# Patient Record
Sex: Female | Born: 1975 | Hispanic: Yes | Marital: Single | Smoking: Current some day smoker
Health system: Southern US, Community
[De-identification: ages and names within clinical notes are randomized; demographics above are authoritative.]

## PROBLEM LIST (undated history)

## (undated) DIAGNOSIS — R011 Cardiac murmur, unspecified: Secondary | ICD-10-CM

## (undated) DIAGNOSIS — H543 Unqualified visual loss, both eyes: Secondary | ICD-10-CM

## (undated) DIAGNOSIS — E119 Type 2 diabetes mellitus without complications: Secondary | ICD-10-CM

## (undated) DIAGNOSIS — I83009 Varicose veins of unspecified lower extremity with ulcer of unspecified site: Secondary | ICD-10-CM

## (undated) DIAGNOSIS — I10 Essential (primary) hypertension: Secondary | ICD-10-CM

## (undated) DIAGNOSIS — L97909 Non-pressure chronic ulcer of unspecified part of unspecified lower leg with unspecified severity: Secondary | ICD-10-CM

## (undated) DIAGNOSIS — Z720 Tobacco use: Secondary | ICD-10-CM

## (undated) DIAGNOSIS — D509 Iron deficiency anemia, unspecified: Secondary | ICD-10-CM

## (undated) HISTORY — DX: Non-pressure chronic ulcer of unspecified part of unspecified lower leg with unspecified severity: L97.909

## (undated) HISTORY — DX: Non-pressure chronic ulcer of unspecified part of unspecified lower leg with unspecified severity: I83.009

## (undated) HISTORY — DX: Cardiac murmur, unspecified: R01.1

## (undated) HISTORY — DX: Iron deficiency anemia, unspecified: D50.9

## (undated) HISTORY — DX: Essential (primary) hypertension: I10

## (undated) HISTORY — PX: EYE SURGERY: SHX253

## (undated) HISTORY — DX: Tobacco use: Z72.0

## (undated) HISTORY — DX: Type 2 diabetes mellitus without complications: E11.9

---
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
## 2004-06-13 ENCOUNTER — Other Ambulatory Visit: Admission: RE | Admit: 2004-06-13 | Discharge: 2004-06-13 | Payer: Self-pay | Admitting: Obstetrics and Gynecology

## 2011-12-16 ENCOUNTER — Encounter (HOSPITAL_COMMUNITY): Payer: Self-pay | Admitting: *Deleted

## 2011-12-16 ENCOUNTER — Inpatient Hospital Stay (HOSPITAL_COMMUNITY)
Admission: AD | Admit: 2011-12-16 | Discharge: 2011-12-16 | Disposition: A | Discharge status: Home / Self Care | Payer: Medicare Other | Source: Ambulatory Visit | Attending: Obstetrics & Gynecology | Admitting: Obstetrics & Gynecology

## 2011-12-16 DIAGNOSIS — N911 Secondary amenorrhea: Secondary | ICD-10-CM

## 2011-12-16 DIAGNOSIS — N912 Amenorrhea, unspecified: Secondary | ICD-10-CM | POA: Insufficient documentation

## 2011-12-16 HISTORY — DX: Unqualified visual loss, both eyes: H54.3

## 2011-12-16 LAB — CBC
HCT: 38.7 % (ref 36.0–46.0)
Hemoglobin: 12.7 g/dL (ref 12.0–15.0)
MCH: 24.1 pg — ABNORMAL LOW (ref 26.0–34.0)
MCHC: 32.8 g/dL (ref 30.0–36.0)
MCV: 73.3 fL — ABNORMAL LOW (ref 78.0–100.0)
Platelets: 321 10*3/uL (ref 150–400)
RBC: 5.28 MIL/uL — ABNORMAL HIGH (ref 3.87–5.11)
RDW: 15.2 % (ref 11.5–15.5)
WBC: 10.1 10*3/uL (ref 4.0–10.5)

## 2011-12-16 LAB — GLUCOSE, RANDOM: Glucose, Bld: 166 mg/dL — ABNORMAL HIGH (ref 70–99)

## 2011-12-16 LAB — HCG, QUANTITATIVE, PREGNANCY: hCG, Beta Chain, Quant, S: <1 m[IU]/mL

## 2011-12-16 NOTE — MAU Note (Signed)
Patient states she has not had a period since 59[birth year]23. Denies any pain or bleeding at this time. Does sometime have lower abdominal pain  when standing and working.
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
## 2011-12-16 NOTE — MAU Note (Signed)
Patient states she has had 2 negative pregnancy tests.

## 2011-12-17 LAB — SEX HORMONE BINDING GLOBULIN: Sex Hormone Binding: 21 nmol/L (ref 18–114)

## 2011-12-17 LAB — TESTOSTERONE: Testosterone: 60.28 ng/dL (ref 10–70)

## 2011-12-17 NOTE — MAU Provider Note (Signed)
Attestation of Attending Supervision of Advanced Practitioner (CNM/NP): Evaluation and management procedures were performed by the Advanced Practitioner under my supervision and collaboration.  I have reviewed the Advanced Practitioner's note and chart, and I agree with the management and plan.  Diannah Rindfleisch, M.D. 12/17/2011 4:46 AM  

## 2011-12-18 LAB — TESTOSTERONE: Testosterone: 60.28 ng/dL (ref 10–70)

## 2011-12-18 LAB — TESTOSTERONE, % FREE: Testosterone-% Free: 2.3 % — ABNORMAL HIGH (ref 0.4–2.4)

## 2011-12-18 LAB — FOLLICLE STIMULATING HORMONE: FSH: 4 m[IU]/mL

## 2011-12-23 ENCOUNTER — Ambulatory Visit (HOSPITAL_COMMUNITY): Payer: Medicare Other

## 2011-12-23 ENCOUNTER — Ambulatory Visit (HOSPITAL_COMMUNITY)
Admission: RE | Admit: 2011-12-23 | Discharge: 2011-12-23 | Disposition: A | Discharge status: Home / Self Care | Payer: Medicare Other | Source: Ambulatory Visit | Attending: Physician Assistant | Admitting: Physician Assistant

## 2011-12-23 DIAGNOSIS — N83209 Unspecified ovarian cyst, unspecified side: Secondary | ICD-10-CM | POA: Insufficient documentation

## 2011-12-23 DIAGNOSIS — N912 Amenorrhea, unspecified: Secondary | ICD-10-CM | POA: Insufficient documentation

## 2011-12-23 DIAGNOSIS — N911 Secondary amenorrhea: Secondary | ICD-10-CM

## 2012-01-06 ENCOUNTER — Ambulatory Visit: Payer: Medicare Other | Admitting: Obstetrics & Gynecology

## 2012-02-07 ENCOUNTER — Encounter: Payer: Self-pay | Admitting: Obstetrics & Gynecology

## 2012-02-07 ENCOUNTER — Ambulatory Visit (INDEPENDENT_AMBULATORY_CARE_PROVIDER_SITE_OTHER): Payer: Medicare Other | Admitting: Obstetrics & Gynecology

## 2012-02-07 VITALS — BP 148/92 | HR 81 | Temp 98.2°F | Resp 12 | Ht 73.0 in | Wt 292.6 lb

## 2012-02-07 DIAGNOSIS — N911 Secondary amenorrhea: Secondary | ICD-10-CM | POA: Insufficient documentation

## 2012-02-07 DIAGNOSIS — N912 Amenorrhea, unspecified: Secondary | ICD-10-CM

## 2012-02-07 DIAGNOSIS — H543 Unqualified visual loss, both eyes: Secondary | ICD-10-CM

## 2012-02-07 MED ORDER — MEDROXYPROGESTERONE ACETATE 10 MG PO TABS: 10.0000 mg | ORAL_TABLET | Freq: Every day | ORAL | Status: DC

## 2012-02-07 NOTE — Patient Instructions (Signed)
Menstruacin (Menstruation) La menstruacin es la eliminacin mensual de sangre, tejidos, lquidos y mucus. Tambin se la conoce como perodo. En este perodo la Agricultural consultant. [place], o cantidad de [place], generalmente dura [place] 3 y [address] cada [address]. El ciclo menstrual est controlado por las hormonas.Las hormonas son sustancias qumicas que produce el organismo para regular sus diferentes funciones. El primer perodo menstrual puede comenzar en cualquier [employer] 8 y los 16 aos. Sin embargo, generalmente [employer] 11 y los [address]. Algunas nias tienen un ciclo menstrual mensual desde el comienzo. Pero, en los comienzos de la [place], no es infrecuente perder slo algunas gotas de sangre o Pension scheme manager la ropa interior. Tampoco es infrecuente [employer] perodos en un mes o que pase un mes o dos sin tenerlo en los primeros meses. SINTOMAS  Clicos abdominales leves a moderados.   Dolor en la zona baja de la espalda.  Los sntomas pueden aparecer [employer] 5 y [address] previos al comienzo del perodo menstrual, y se lo denomina sndrome premenstrual.  Dolor de [place].   Dolor e hinchazn en las mamas.   Hinchazn.   Somnolencia (fatiga.   Cambios en el estado de nimo.   Deseo intenso de consumir ciertos alimentos.  Estos son signos y sntomas normales y Orthoptist. Para ayudar a [employer], consulte con su mdico si puede tomar medicamentos de venta libre para el dolor o las [place]. Si no puede controlar los sntomas, consulte con su mdico.  LAS HORMONAS QUE INTERVIENEN EN EL CICLO MENSTRUAL La menstruacin se produce debido a las hormonas que segrega la glndula pituitaria en el cerebro y a los ovarios que afectan la superficie del tero. Primero, la glndula pituitaria produce en el cerebro la hormona estimulante del folculo.  Esta hormona estimula la produccin de [employer] ovarios, lo que engruesa la superficie del  tero y comienza a Environmental education officer un vulo en el ovario. Aproximadamente 14 das despus, la glndula pituitaria produce otra hormona denominada hormona luteinizante. La hormona luteinizante hace que el vulo salga del saco en el que se encuentra dentro del ovario. El saco vaco del ovario, denominado cuerpo lteo es estimulado por otra hormona proveniente de la glndula pituitaria que se denomina luteotrofina. El cuerpo lteo comienza a producir estrgenos y Education officer, museum. La progesterona prepara la superficie del tero para que el vulo fertilizado (vulo y espermatozoide) se adhiera a esta superficie del tero y comience a desarrollarse para formar el feto. Si el vulo no es fertilizado, el cuerpo lteo deja de producir estrgenos y Education officer, museum y desaparece, la superficie del tero se desprende y comienza el perodo menstrual. Entonces el ciclo menstrual se inicia una y [place] vez y Fish farm manager, excepto que ocurra un Psychiatrist o comience la menopausia. La secrecin de hormonas es un proceso complejo. Varias partes del organismo estn involucradas en muchas actividades qumicas. Las hormonas sexuales femeninas tambin cumplen otras funciones en el organismo de la [place]. Los estrgenos [employer] impulso sexual de Architectural technologist (libido). Es un diurtico natural (ayuda al organismo a [employer] lquidos). Tambin interviene en el proceso de formacin los [place]. Por lo tanto, Pharmacologist la salud hormonal es fundamental para todos los niveles del bienestar de la [place]. Generalmente estas hormonas estn presentes en cantidades normales y producen el ciclo menstrual. Lo ms importante es la relacin entre estos (pequeos) niveles de hormonas. Cuando el equilibrio se [place], se producen irregularidades menstruales. CMO SE PRODUCE EL CICLO  MENSTRUAL?  Los ciclos menstruales varan entre 21 y [address] siendo el promedio de [address]. El ciclo comienza Film/video editor en que se produce el sangrado. En este momento, la  glndula pituitaria en el cerebro libera FSH, que viaja a travs del torrente [employer]. La FSH estimula los folculos en los ovarios. Prepara al organismo para la ovulacin, la que se produce alrededor del da 14 del ciclo. Luego los ovarios liberan estrgenos y esto asegura que las condiciones en el tero sean las adecuadas para la implantacin del vulo fertilizado.   Cuando los niveles de estrgenos alcanzan un nivel lo suficientemente elevado, envan una seal a una glndula que se encuentra en el cerebro (glndula pituitaria) para liberar cierta cantidad de LH. Esto provoca la liberacin del vulo maduro del folculo (ovulacin). Generalmente slo un folculo libera un huevo, pero en algunos casos ms de un folculo liberan huevos, especialmente cuando se estimulan los ovarios por fertilizacin in vitro. Luego el vulo se instala en la trompa de [place] ms cercana y Artist. El folculo que ha estallado dentro del ovario, y que [place], ahora se denomina cuerpo lteo ( o "cuerpo amarillo") El cuerpo lteo sigue liberando (segregando) cantidades reducidas de estrgeno. Esto hace que se cierre y se endurezca el crvix. Esto seca el mucus llevndolo al estado natural de infertilidad.   El cuerpo lteo tambin comienza a Museum/gallery conservator grandes cantidades de progesterona. Esto hace que la cobertura interna del tero (el endometrio) se espese an ms, preparndose totalmente para recibir al vulo El vulo comienza su trayecto [place], desde las trompas de [employer]. Y le enva a los ovarios la seal para que no liberen ms vulos. Interviene en el regreso del mucus cervical a su estado de infertilidad.   Si el vulo se implanta exitosamente en el tejido que recubre internamente el tero y se produce el [place], los niveles de progesterona continuarn [place]. Generalmente, esta es la hormona que favorece en algunas mujeres embarazadas la sensacin de  [place], como una "euforia natural". Los niveles de progesterona vuelven a Software engineer despus del parto   Si la fertilizacin no se produce, el cuerpo [employer], y cesa la produccin de hormonas. Esta cada sbita de los niveles de progesterona hace que la superficie interna uterina se rompa, lo que produce una hemorragia (menstruacin).   Y as vuelve a Oncologist 1. Todo el proceso se repite nuevamente. Las mujeres atraviesan este ciclo todos los meses, desde la pubertad a la menopausia. El ciclo se interrumpe slo en el caso de embarazo y Tour manager, excepto que la mujer sufra problemas de salud que afecten su sistema hormonal o elija utilizar anticonceptivos orales y tenga perodos menstruales no naturales.  INSTRUCCIONES PARA EL CUIDADO DOMICILIARIO  Mantenga un registro de sus perodos utilizando un calendario.   Si utiliza tampones, use los menos absorbentes para evitar el sndrome de shock txico.   No deje el tampn en la vagina durante la noche o ms de 6 horas.   Utilice una toallita higinica para dormir.   Realice actividad fsica 3 a 5 veces por semana, o ms.   Evite los alimentos y bebidas que sabe empeorarn sus sntomas antes o durante el perodo.  SOLICITE ANTENCIN MDICA SI:  Su temperatura se eleva por encima de 100 F (37.8 C).   Sus perodos duran ms de [address].   Son tan abundantes que debe cambiarse el apsito o  el tampn cada 30 minutos.   Observa que elimina cogulos y nunca los haba tenido antes.   No obtiene alivio de sus sntomas con los medicamentos de [address].   Su perodo no se ha iniciado y han pasado ms de [address].  Document Released: 03/06/2005 Document Revised: 05/16/2011 [facility] Patient Information 2012 [place], [place].
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
## 2014-01-07 ENCOUNTER — Encounter (HOSPITAL_BASED_OUTPATIENT_CLINIC_OR_DEPARTMENT_OTHER): Payer: Medicare Other | Attending: General Surgery

## 2014-01-07 DIAGNOSIS — I10 Essential (primary) hypertension: Secondary | ICD-10-CM | POA: Diagnosis not present

## 2014-01-07 DIAGNOSIS — H543 Unqualified visual loss, both eyes: Secondary | ICD-10-CM | POA: Insufficient documentation

## 2014-01-07 DIAGNOSIS — E119 Type 2 diabetes mellitus without complications: Secondary | ICD-10-CM | POA: Diagnosis not present

## 2014-01-07 DIAGNOSIS — Z79899 Other long term (current) drug therapy: Secondary | ICD-10-CM | POA: Diagnosis not present

## 2014-01-07 DIAGNOSIS — L97809 Non-pressure chronic ulcer of other part of unspecified lower leg with unspecified severity: Secondary | ICD-10-CM | POA: Diagnosis present
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
## 2014-01-14 ENCOUNTER — Encounter (HOSPITAL_BASED_OUTPATIENT_CLINIC_OR_DEPARTMENT_OTHER): Payer: Medicare Other | Attending: General Surgery

## 2014-01-14 DIAGNOSIS — I87339 Chronic venous hypertension (idiopathic) with ulcer and inflammation of unspecified lower extremity: Secondary | ICD-10-CM | POA: Diagnosis present

## 2014-01-14 DIAGNOSIS — L97809 Non-pressure chronic ulcer of other part of unspecified lower leg with unspecified severity: Secondary | ICD-10-CM | POA: Diagnosis not present

## 2014-01-14 DIAGNOSIS — L97909 Non-pressure chronic ulcer of unspecified part of unspecified lower leg with unspecified severity: Principal | ICD-10-CM

## 2014-01-21 DIAGNOSIS — I87339 Chronic venous hypertension (idiopathic) with ulcer and inflammation of unspecified lower extremity: Secondary | ICD-10-CM | POA: Diagnosis not present

## 2014-01-21 DIAGNOSIS — L97809 Non-pressure chronic ulcer of other part of unspecified lower leg with unspecified severity: Secondary | ICD-10-CM | POA: Diagnosis not present

## 2014-01-28 DIAGNOSIS — L97809 Non-pressure chronic ulcer of other part of unspecified lower leg with unspecified severity: Secondary | ICD-10-CM | POA: Diagnosis not present

## 2014-01-28 DIAGNOSIS — I87339 Chronic venous hypertension (idiopathic) with ulcer and inflammation of unspecified lower extremity: Secondary | ICD-10-CM | POA: Diagnosis not present

## 2014-02-04 DIAGNOSIS — L97809 Non-pressure chronic ulcer of other part of unspecified lower leg with unspecified severity: Secondary | ICD-10-CM | POA: Diagnosis not present

## 2014-02-04 DIAGNOSIS — I87339 Chronic venous hypertension (idiopathic) with ulcer and inflammation of unspecified lower extremity: Secondary | ICD-10-CM | POA: Diagnosis not present

## 2014-02-11 ENCOUNTER — Encounter (HOSPITAL_BASED_OUTPATIENT_CLINIC_OR_DEPARTMENT_OTHER): Payer: Medicare Other | Attending: General Surgery

## 2014-02-11 DIAGNOSIS — L97909 Non-pressure chronic ulcer of unspecified part of unspecified lower leg with unspecified severity: Secondary | ICD-10-CM | POA: Insufficient documentation

## 2014-02-11 DIAGNOSIS — E1169 Type 2 diabetes mellitus with other specified complication: Secondary | ICD-10-CM | POA: Insufficient documentation

## 2014-02-11 DIAGNOSIS — I872 Venous insufficiency (chronic) (peripheral): Secondary | ICD-10-CM | POA: Diagnosis not present

## 2014-02-17 ENCOUNTER — Ambulatory Visit (HOSPITAL_COMMUNITY)
Admission: RE | Admit: 2014-02-17 | Discharge: 2014-02-17 | Disposition: A | Discharge status: Home / Self Care | Payer: Medicare Other | Source: Ambulatory Visit | Attending: Vascular Surgery | Admitting: Vascular Surgery

## 2014-02-17 ENCOUNTER — Other Ambulatory Visit (HOSPITAL_COMMUNITY): Payer: Self-pay | Admitting: General Surgery

## 2014-02-17 DIAGNOSIS — L97909 Non-pressure chronic ulcer of unspecified part of unspecified lower leg with unspecified severity: Secondary | ICD-10-CM

## 2014-02-17 DIAGNOSIS — I739 Peripheral vascular disease, unspecified: Secondary | ICD-10-CM | POA: Insufficient documentation

## 2014-02-18 DIAGNOSIS — L97909 Non-pressure chronic ulcer of unspecified part of unspecified lower leg with unspecified severity: Secondary | ICD-10-CM | POA: Diagnosis not present

## 2014-02-18 DIAGNOSIS — E1169 Type 2 diabetes mellitus with other specified complication: Secondary | ICD-10-CM | POA: Diagnosis not present

## 2014-02-18 DIAGNOSIS — I872 Venous insufficiency (chronic) (peripheral): Secondary | ICD-10-CM | POA: Diagnosis not present

## 2014-02-25 DIAGNOSIS — E1169 Type 2 diabetes mellitus with other specified complication: Secondary | ICD-10-CM | POA: Diagnosis not present

## 2014-02-25 DIAGNOSIS — I872 Venous insufficiency (chronic) (peripheral): Secondary | ICD-10-CM | POA: Diagnosis not present

## 2014-02-25 DIAGNOSIS — L97909 Non-pressure chronic ulcer of unspecified part of unspecified lower leg with unspecified severity: Secondary | ICD-10-CM | POA: Diagnosis not present

## 2014-03-04 DIAGNOSIS — I872 Venous insufficiency (chronic) (peripheral): Secondary | ICD-10-CM | POA: Diagnosis not present

## 2014-03-04 DIAGNOSIS — E1169 Type 2 diabetes mellitus with other specified complication: Secondary | ICD-10-CM | POA: Diagnosis not present

## 2014-03-04 DIAGNOSIS — L97909 Non-pressure chronic ulcer of unspecified part of unspecified lower leg with unspecified severity: Secondary | ICD-10-CM | POA: Diagnosis not present

## 2014-03-11 ENCOUNTER — Encounter (HOSPITAL_BASED_OUTPATIENT_CLINIC_OR_DEPARTMENT_OTHER): Payer: Medicare Other | Attending: General Surgery

## 2014-03-11 DIAGNOSIS — L97919 Non-pressure chronic ulcer of unspecified part of right lower leg with unspecified severity: Secondary | ICD-10-CM | POA: Insufficient documentation

## 2014-03-11 DIAGNOSIS — I87331 Chronic venous hypertension (idiopathic) with ulcer and inflammation of right lower extremity: Secondary | ICD-10-CM | POA: Diagnosis present

## 2014-03-18 DIAGNOSIS — I87331 Chronic venous hypertension (idiopathic) with ulcer and inflammation of right lower extremity: Secondary | ICD-10-CM | POA: Diagnosis not present

## 2014-03-18 DIAGNOSIS — L97919 Non-pressure chronic ulcer of unspecified part of right lower leg with unspecified severity: Secondary | ICD-10-CM | POA: Diagnosis not present

## 2014-03-24 ENCOUNTER — Encounter: Payer: Self-pay | Admitting: Vascular Surgery

## 2014-03-25 ENCOUNTER — Encounter: Payer: Self-pay | Admitting: Vascular Surgery

## 2014-03-25 ENCOUNTER — Ambulatory Visit (INDEPENDENT_AMBULATORY_CARE_PROVIDER_SITE_OTHER): Payer: Medicare Other | Admitting: Vascular Surgery

## 2014-03-25 VITALS — BP 125/85 | HR 89 | Ht 73.0 in | Wt 283.0 lb

## 2014-03-25 DIAGNOSIS — L97909 Non-pressure chronic ulcer of unspecified part of unspecified lower leg with unspecified severity: Secondary | ICD-10-CM

## 2014-03-25 DIAGNOSIS — I83012 Varicose veins of right lower extremity with ulcer of calf: Secondary | ICD-10-CM

## 2014-03-25 DIAGNOSIS — I83014 Varicose veins of right lower extremity with ulcer of heel and midfoot: Secondary | ICD-10-CM

## 2014-03-25 DIAGNOSIS — I87331 Chronic venous hypertension (idiopathic) with ulcer and inflammation of right lower extremity: Secondary | ICD-10-CM | POA: Diagnosis not present

## 2014-03-25 DIAGNOSIS — I872 Venous insufficiency (chronic) (peripheral): Secondary | ICD-10-CM | POA: Insufficient documentation

## 2014-03-25 DIAGNOSIS — I83015 Varicose veins of right lower extremity with ulcer other part of foot: Secondary | ICD-10-CM

## 2014-03-25 DIAGNOSIS — I83013 Varicose veins of right lower extremity with ulcer of ankle: Secondary | ICD-10-CM

## 2014-03-25 DIAGNOSIS — I83019 Varicose veins of right lower extremity with ulcer of unspecified site: Secondary | ICD-10-CM

## 2014-03-25 DIAGNOSIS — L97919 Non-pressure chronic ulcer of unspecified part of right lower leg with unspecified severity: Secondary | ICD-10-CM | POA: Diagnosis not present

## 2014-03-25 DIAGNOSIS — I83009 Varicose veins of unspecified lower extremity with ulcer of unspecified site: Secondary | ICD-10-CM | POA: Insufficient documentation

## 2014-03-25 DIAGNOSIS — I83011 Varicose veins of right lower extremity with ulcer of thigh: Secondary | ICD-10-CM

## 2014-03-25 DIAGNOSIS — IMO0001 Reserved for inherently not codable concepts without codable children: Secondary | ICD-10-CM

## 2014-03-25 DIAGNOSIS — I83018 Varicose veins of right lower extremity with ulcer other part of lower leg: Secondary | ICD-10-CM
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
## 2014-04-01 DIAGNOSIS — L97919 Non-pressure chronic ulcer of unspecified part of right lower leg with unspecified severity: Secondary | ICD-10-CM | POA: Diagnosis not present

## 2014-04-01 DIAGNOSIS — I87331 Chronic venous hypertension (idiopathic) with ulcer and inflammation of right lower extremity: Secondary | ICD-10-CM | POA: Diagnosis not present

## 2014-04-08 DIAGNOSIS — I87331 Chronic venous hypertension (idiopathic) with ulcer and inflammation of right lower extremity: Secondary | ICD-10-CM | POA: Diagnosis not present

## 2014-04-08 DIAGNOSIS — L97919 Non-pressure chronic ulcer of unspecified part of right lower leg with unspecified severity: Secondary | ICD-10-CM | POA: Diagnosis not present

## 2014-04-15 ENCOUNTER — Encounter (HOSPITAL_BASED_OUTPATIENT_CLINIC_OR_DEPARTMENT_OTHER): Payer: Medicare Other | Attending: General Surgery

## 2014-04-15 DIAGNOSIS — I87331 Chronic venous hypertension (idiopathic) with ulcer and inflammation of right lower extremity: Secondary | ICD-10-CM | POA: Diagnosis not present

## 2014-04-15 DIAGNOSIS — L97811 Non-pressure chronic ulcer of other part of right lower leg limited to breakdown of skin: Secondary | ICD-10-CM | POA: Insufficient documentation

## 2014-04-22 DIAGNOSIS — L97811 Non-pressure chronic ulcer of other part of right lower leg limited to breakdown of skin: Secondary | ICD-10-CM | POA: Diagnosis not present

## 2014-04-22 DIAGNOSIS — I87331 Chronic venous hypertension (idiopathic) with ulcer and inflammation of right lower extremity: Secondary | ICD-10-CM | POA: Diagnosis not present

## 2014-04-29 DIAGNOSIS — I87331 Chronic venous hypertension (idiopathic) with ulcer and inflammation of right lower extremity: Secondary | ICD-10-CM | POA: Diagnosis not present

## 2014-05-04 DIAGNOSIS — L97811 Non-pressure chronic ulcer of other part of right lower leg limited to breakdown of skin: Secondary | ICD-10-CM | POA: Diagnosis not present

## 2014-05-04 DIAGNOSIS — I87331 Chronic venous hypertension (idiopathic) with ulcer and inflammation of right lower extremity: Secondary | ICD-10-CM | POA: Diagnosis not present

## 2014-05-13 ENCOUNTER — Encounter (HOSPITAL_BASED_OUTPATIENT_CLINIC_OR_DEPARTMENT_OTHER): Payer: Medicare Other | Attending: General Surgery

## 2014-05-13 DIAGNOSIS — I87332 Chronic venous hypertension (idiopathic) with ulcer and inflammation of left lower extremity: Secondary | ICD-10-CM | POA: Insufficient documentation

## 2014-05-13 DIAGNOSIS — L97929 Non-pressure chronic ulcer of unspecified part of left lower leg with unspecified severity: Secondary | ICD-10-CM | POA: Insufficient documentation

## 2014-05-20 DIAGNOSIS — I87332 Chronic venous hypertension (idiopathic) with ulcer and inflammation of left lower extremity: Secondary | ICD-10-CM | POA: Diagnosis not present

## 2014-05-20 DIAGNOSIS — L97929 Non-pressure chronic ulcer of unspecified part of left lower leg with unspecified severity: Secondary | ICD-10-CM | POA: Diagnosis not present

## 2014-05-27 DIAGNOSIS — I87332 Chronic venous hypertension (idiopathic) with ulcer and inflammation of left lower extremity: Secondary | ICD-10-CM | POA: Diagnosis not present

## 2014-05-27 DIAGNOSIS — L97929 Non-pressure chronic ulcer of unspecified part of left lower leg with unspecified severity: Secondary | ICD-10-CM | POA: Diagnosis not present

## 2014-06-02 DIAGNOSIS — I87332 Chronic venous hypertension (idiopathic) with ulcer and inflammation of left lower extremity: Secondary | ICD-10-CM | POA: Diagnosis not present

## 2014-06-02 DIAGNOSIS — L97929 Non-pressure chronic ulcer of unspecified part of left lower leg with unspecified severity: Secondary | ICD-10-CM | POA: Diagnosis not present

## 2014-06-09 DIAGNOSIS — L97929 Non-pressure chronic ulcer of unspecified part of left lower leg with unspecified severity: Secondary | ICD-10-CM | POA: Diagnosis not present

## 2014-06-09 DIAGNOSIS — I87332 Chronic venous hypertension (idiopathic) with ulcer and inflammation of left lower extremity: Secondary | ICD-10-CM | POA: Diagnosis not present

## 2014-06-17 ENCOUNTER — Encounter (HOSPITAL_BASED_OUTPATIENT_CLINIC_OR_DEPARTMENT_OTHER): Payer: Medicare Other | Attending: Internal Medicine

## 2014-06-17 DIAGNOSIS — I87331 Chronic venous hypertension (idiopathic) with ulcer and inflammation of right lower extremity: Secondary | ICD-10-CM | POA: Insufficient documentation

## 2014-06-17 DIAGNOSIS — L97311 Non-pressure chronic ulcer of right ankle limited to breakdown of skin: Secondary | ICD-10-CM | POA: Diagnosis not present

## 2014-06-24 DIAGNOSIS — L97311 Non-pressure chronic ulcer of right ankle limited to breakdown of skin: Secondary | ICD-10-CM | POA: Diagnosis not present

## 2014-06-24 DIAGNOSIS — I87331 Chronic venous hypertension (idiopathic) with ulcer and inflammation of right lower extremity: Secondary | ICD-10-CM | POA: Diagnosis not present

## 2014-06-28 ENCOUNTER — Ambulatory Visit: Payer: Medicare Other | Admitting: Vascular Surgery

## 2014-07-04 ENCOUNTER — Encounter: Payer: Self-pay | Admitting: Vascular Surgery

## 2014-07-05 ENCOUNTER — Ambulatory Visit: Payer: Medicare Other | Admitting: Vascular Surgery

## 2014-07-08 DIAGNOSIS — I87331 Chronic venous hypertension (idiopathic) with ulcer and inflammation of right lower extremity: Secondary | ICD-10-CM | POA: Diagnosis not present

## 2014-07-08 DIAGNOSIS — L97311 Non-pressure chronic ulcer of right ankle limited to breakdown of skin: Secondary | ICD-10-CM | POA: Diagnosis not present

## 2014-07-15 ENCOUNTER — Encounter (HOSPITAL_BASED_OUTPATIENT_CLINIC_OR_DEPARTMENT_OTHER): Payer: Medicare Other | Attending: Internal Medicine

## 2014-07-15 DIAGNOSIS — L97919 Non-pressure chronic ulcer of unspecified part of right lower leg with unspecified severity: Secondary | ICD-10-CM | POA: Insufficient documentation

## 2014-07-15 DIAGNOSIS — I87331 Chronic venous hypertension (idiopathic) with ulcer and inflammation of right lower extremity: Secondary | ICD-10-CM | POA: Insufficient documentation

## 2014-07-22 DIAGNOSIS — L97919 Non-pressure chronic ulcer of unspecified part of right lower leg with unspecified severity: Secondary | ICD-10-CM | POA: Diagnosis not present

## 2014-07-22 DIAGNOSIS — I87331 Chronic venous hypertension (idiopathic) with ulcer and inflammation of right lower extremity: Secondary | ICD-10-CM | POA: Diagnosis not present

## 2014-07-28 DIAGNOSIS — L97919 Non-pressure chronic ulcer of unspecified part of right lower leg with unspecified severity: Secondary | ICD-10-CM | POA: Diagnosis not present

## 2014-07-28 DIAGNOSIS — I87331 Chronic venous hypertension (idiopathic) with ulcer and inflammation of right lower extremity: Secondary | ICD-10-CM | POA: Diagnosis not present

## 2014-08-12 ENCOUNTER — Encounter (HOSPITAL_BASED_OUTPATIENT_CLINIC_OR_DEPARTMENT_OTHER): Payer: Medicare Other | Attending: Internal Medicine

## 2014-08-12 DIAGNOSIS — L97811 Non-pressure chronic ulcer of other part of right lower leg limited to breakdown of skin: Secondary | ICD-10-CM | POA: Insufficient documentation

## 2014-08-12 DIAGNOSIS — I87331 Chronic venous hypertension (idiopathic) with ulcer and inflammation of right lower extremity: Secondary | ICD-10-CM | POA: Insufficient documentation

## 2014-08-19 DIAGNOSIS — I87331 Chronic venous hypertension (idiopathic) with ulcer and inflammation of right lower extremity: Secondary | ICD-10-CM | POA: Diagnosis not present

## 2014-08-26 DIAGNOSIS — I87331 Chronic venous hypertension (idiopathic) with ulcer and inflammation of right lower extremity: Secondary | ICD-10-CM | POA: Diagnosis not present

## 2014-08-26 DIAGNOSIS — L97811 Non-pressure chronic ulcer of other part of right lower leg limited to breakdown of skin: Secondary | ICD-10-CM | POA: Diagnosis not present

## 2014-08-29 LAB — GLUCOSE, CAPILLARY: Glucose-Capillary: 168 mg/dL — ABNORMAL HIGH (ref 70–99)

## 2014-09-09 ENCOUNTER — Encounter (HOSPITAL_BASED_OUTPATIENT_CLINIC_OR_DEPARTMENT_OTHER): Payer: Medicare Other | Attending: Internal Medicine

## 2014-09-09 DIAGNOSIS — L97811 Non-pressure chronic ulcer of other part of right lower leg limited to breakdown of skin: Secondary | ICD-10-CM | POA: Diagnosis not present

## 2014-09-09 DIAGNOSIS — I872 Venous insufficiency (chronic) (peripheral): Secondary | ICD-10-CM | POA: Diagnosis not present

## 2014-09-16 DIAGNOSIS — I872 Venous insufficiency (chronic) (peripheral): Secondary | ICD-10-CM | POA: Diagnosis not present

## 2014-09-23 DIAGNOSIS — I872 Venous insufficiency (chronic) (peripheral): Secondary | ICD-10-CM | POA: Diagnosis not present

## 2014-09-23 DIAGNOSIS — L97811 Non-pressure chronic ulcer of other part of right lower leg limited to breakdown of skin: Secondary | ICD-10-CM | POA: Diagnosis not present

## 2014-10-07 DIAGNOSIS — L97811 Non-pressure chronic ulcer of other part of right lower leg limited to breakdown of skin: Secondary | ICD-10-CM | POA: Diagnosis not present

## 2014-10-07 DIAGNOSIS — I872 Venous insufficiency (chronic) (peripheral): Secondary | ICD-10-CM | POA: Diagnosis not present

## 2014-10-14 ENCOUNTER — Encounter (HOSPITAL_BASED_OUTPATIENT_CLINIC_OR_DEPARTMENT_OTHER): Payer: Medicare Other | Attending: Internal Medicine

## 2014-10-14 DIAGNOSIS — L97811 Non-pressure chronic ulcer of other part of right lower leg limited to breakdown of skin: Secondary | ICD-10-CM | POA: Diagnosis not present

## 2014-10-14 DIAGNOSIS — I872 Venous insufficiency (chronic) (peripheral): Secondary | ICD-10-CM | POA: Insufficient documentation

## 2014-10-21 DIAGNOSIS — L97811 Non-pressure chronic ulcer of other part of right lower leg limited to breakdown of skin: Secondary | ICD-10-CM | POA: Diagnosis not present

## 2014-10-21 DIAGNOSIS — I872 Venous insufficiency (chronic) (peripheral): Secondary | ICD-10-CM | POA: Diagnosis not present

## 2014-10-28 DIAGNOSIS — L97811 Non-pressure chronic ulcer of other part of right lower leg limited to breakdown of skin: Secondary | ICD-10-CM | POA: Diagnosis not present

## 2014-10-28 DIAGNOSIS — I872 Venous insufficiency (chronic) (peripheral): Secondary | ICD-10-CM | POA: Diagnosis not present

## 2014-11-04 DIAGNOSIS — L97811 Non-pressure chronic ulcer of other part of right lower leg limited to breakdown of skin: Secondary | ICD-10-CM | POA: Diagnosis not present

## 2014-11-04 DIAGNOSIS — I872 Venous insufficiency (chronic) (peripheral): Secondary | ICD-10-CM | POA: Diagnosis not present

## 2014-11-11 ENCOUNTER — Encounter (HOSPITAL_BASED_OUTPATIENT_CLINIC_OR_DEPARTMENT_OTHER): Payer: Medicare Other | Attending: Internal Medicine

## 2014-11-11 DIAGNOSIS — I872 Venous insufficiency (chronic) (peripheral): Secondary | ICD-10-CM | POA: Diagnosis not present

## 2014-11-11 DIAGNOSIS — L97811 Non-pressure chronic ulcer of other part of right lower leg limited to breakdown of skin: Secondary | ICD-10-CM | POA: Insufficient documentation

## 2014-11-18 DIAGNOSIS — L97811 Non-pressure chronic ulcer of other part of right lower leg limited to breakdown of skin: Secondary | ICD-10-CM | POA: Diagnosis not present

## 2014-11-18 DIAGNOSIS — I872 Venous insufficiency (chronic) (peripheral): Secondary | ICD-10-CM | POA: Diagnosis not present

## 2014-11-25 DIAGNOSIS — L97811 Non-pressure chronic ulcer of other part of right lower leg limited to breakdown of skin: Secondary | ICD-10-CM | POA: Diagnosis not present

## 2014-11-25 DIAGNOSIS — I872 Venous insufficiency (chronic) (peripheral): Secondary | ICD-10-CM | POA: Diagnosis not present

## 2014-12-02 DIAGNOSIS — I872 Venous insufficiency (chronic) (peripheral): Secondary | ICD-10-CM | POA: Diagnosis not present

## 2014-12-02 DIAGNOSIS — L97811 Non-pressure chronic ulcer of other part of right lower leg limited to breakdown of skin: Secondary | ICD-10-CM | POA: Diagnosis not present

## 2014-12-09 ENCOUNTER — Encounter (HOSPITAL_BASED_OUTPATIENT_CLINIC_OR_DEPARTMENT_OTHER): Payer: Medicare Other | Attending: Internal Medicine

## 2014-12-09 DIAGNOSIS — I872 Venous insufficiency (chronic) (peripheral): Secondary | ICD-10-CM | POA: Insufficient documentation

## 2014-12-09 DIAGNOSIS — L97811 Non-pressure chronic ulcer of other part of right lower leg limited to breakdown of skin: Secondary | ICD-10-CM | POA: Insufficient documentation

## 2016-03-18 ENCOUNTER — Encounter (HOSPITAL_COMMUNITY): Payer: Self-pay | Admitting: Family Medicine

## 2016-03-18 ENCOUNTER — Ambulatory Visit (HOSPITAL_COMMUNITY)
Admission: EM | Admit: 2016-03-18 | Discharge: 2016-03-18 | Disposition: A | Discharge status: Home / Self Care | Payer: Medicare Other | Attending: Internal Medicine | Admitting: Internal Medicine

## 2016-03-18 DIAGNOSIS — E118 Type 2 diabetes mellitus with unspecified complications: Secondary | ICD-10-CM | POA: Diagnosis not present

## 2016-03-18 DIAGNOSIS — Z760 Encounter for issue of repeat prescription: Secondary | ICD-10-CM

## 2016-03-18 MED ORDER — METFORMIN HCL 1000 MG PO TABS: 1000.0000 mg | ORAL_TABLET | Freq: Two times a day (BID) | ORAL | 1 refills | Status: DC

## 2016-03-18 MED ORDER — GLIMEPIRIDE 2 MG PO TABS: 2.0000 mg | ORAL_TABLET | Freq: Every day | ORAL | 0 refills | Status: DC

## 2016-03-18 NOTE — ED Triage Notes (Signed)
Pt here for medication refill. She needs all of her meds
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
## 2016-04-25 ENCOUNTER — Ambulatory Visit: Payer: Medicare Other | Admitting: Nurse Practitioner

## 2016-05-17 ENCOUNTER — Ambulatory Visit (INDEPENDENT_AMBULATORY_CARE_PROVIDER_SITE_OTHER): Payer: Medicare Other | Admitting: Internal Medicine

## 2016-05-17 ENCOUNTER — Encounter: Payer: Self-pay | Admitting: Internal Medicine

## 2016-05-17 VITALS — BP 139/74 | HR 80 | Temp 98.1°F | Ht 73.0 in | Wt 295.5 lb

## 2016-05-17 DIAGNOSIS — Z0000 Encounter for general adult medical examination without abnormal findings: Secondary | ICD-10-CM | POA: Insufficient documentation

## 2016-05-17 DIAGNOSIS — I10 Essential (primary) hypertension: Secondary | ICD-10-CM | POA: Diagnosis not present

## 2016-05-17 DIAGNOSIS — E118 Type 2 diabetes mellitus with unspecified complications: Secondary | ICD-10-CM | POA: Diagnosis not present

## 2016-05-17 DIAGNOSIS — Z7984 Long term (current) use of oral hypoglycemic drugs: Secondary | ICD-10-CM | POA: Diagnosis not present

## 2016-05-17 DIAGNOSIS — F329 Major depressive disorder, single episode, unspecified: Secondary | ICD-10-CM

## 2016-05-17 DIAGNOSIS — F32A Depression, unspecified: Secondary | ICD-10-CM

## 2016-05-17 DIAGNOSIS — E119 Type 2 diabetes mellitus without complications: Secondary | ICD-10-CM | POA: Insufficient documentation

## 2016-05-17 DIAGNOSIS — F17210 Nicotine dependence, cigarettes, uncomplicated: Secondary | ICD-10-CM

## 2016-05-17 DIAGNOSIS — R109 Unspecified abdominal pain: Secondary | ICD-10-CM

## 2016-05-17 DIAGNOSIS — D509 Iron deficiency anemia, unspecified: Secondary | ICD-10-CM | POA: Insufficient documentation

## 2016-05-17 DIAGNOSIS — Z8249 Family history of ischemic heart disease and other diseases of the circulatory system: Secondary | ICD-10-CM

## 2016-05-17 DIAGNOSIS — D649 Anemia, unspecified: Secondary | ICD-10-CM

## 2016-05-17 DIAGNOSIS — Z79899 Other long term (current) drug therapy: Secondary | ICD-10-CM

## 2016-05-17 LAB — GLUCOSE, CAPILLARY: Glucose-Capillary: 228 mg/dL — ABNORMAL HIGH (ref 65–99)

## 2016-05-17 LAB — POCT GLYCOSYLATED HEMOGLOBIN (HGB A1C): Hemoglobin A1C: 9.1

## 2016-05-17 MED ORDER — GLIMEPIRIDE 2 MG PO TABS: 4.0000 mg | ORAL_TABLET | Freq: Every day | ORAL | 1 refills | Status: DC

## 2016-05-17 MED ORDER — SERTRALINE HCL 50 MG PO TABS: 50.0000 mg | ORAL_TABLET | Freq: Every day | ORAL | 2 refills | Status: DC

## 2016-05-17 NOTE — Patient Instructions (Addendum)
Please continue to take your medications as prescribed.   We have increased your Glimepiride to 4mg  daily to better control your diabetes.  We have started a new medication, Zoloft to help improve your depression. This medication may take 2-3 weeks to start having an effect.  We collected a number of lab tests today, we will call if any have abnormal results.  Please return to clinic in 2-4 weeks, you can call [phone] to schedule an appointment.  ----------------------------------- -----------------------------------  Contine tomando sus medicamentos segn lo recetado.  Hemos aumentado su Glimepiride a 4 mg diarios para controlar mejor su diabetes.  Hemos comenzado un nuevo medicamento, Zoloft para ayudar a mejorar su depresin. Este medicamento puede tomar 2-3 semanas para comenzar a Research scientist (medical).  Hoy recolectamos varias pruebas de laboratorio, las llamaremos si tienen [employer].  Regrese a la clnica en 2-4 semanas, puede llamar al [phone] para programar una cita.
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
## 2016-05-18 ENCOUNTER — Encounter: Payer: Self-pay | Admitting: Internal Medicine

## 2016-05-18 DIAGNOSIS — K439 Ventral hernia without obstruction or gangrene: Secondary | ICD-10-CM | POA: Insufficient documentation

## 2016-05-18 LAB — CMP14 + ANION GAP
ALBUMIN: 3.9 g/dL (ref 3.5–5.5)
ALT: 49 IU/L — ABNORMAL HIGH (ref 0–32)
AST: 32 IU/L (ref 0–40)
Albumin/Globulin Ratio: 1.6 (ref 1.2–2.2)
Alkaline Phosphatase: 98 IU/L (ref 39–117)
Anion Gap: 18 mmol/L (ref 10.0–18.0)
BILIRUBIN TOTAL: 0.2 mg/dL (ref 0.0–1.2)
BUN / CREAT RATIO: 13 (ref 9–23)
BUN: 6 mg/dL (ref 6–20)
CHLORIDE: 99 mmol/L (ref 96–106)
CO2: 19 mmol/L (ref 18–29)
Calcium: 8.7 mg/dL (ref 8.7–10.2)
Creatinine, Ser: 0.47 mg/dL — ABNORMAL LOW (ref 0.57–1.00)
GFR calc Af Amer: 145 mL/min/{1.73_m2} (ref >59)
GFR calc non Af Amer: 126 mL/min/{1.73_m2} (ref >59)
GLOBULIN, TOTAL: 2.5 g/dL (ref 1.5–4.5)
Glucose: 192 mg/dL — ABNORMAL HIGH (ref 65–99)
Potassium: 3.8 mmol/L (ref 3.5–5.2)
SODIUM: 136 mmol/L (ref 134–144)
TOTAL PROTEIN: 6.4 g/dL (ref 6.0–8.5)

## 2016-05-18 LAB — FERRITIN: Ferritin: 10 ng/mL — ABNORMAL LOW (ref 15–150)

## 2016-05-18 LAB — LIPID PANEL
Chol/HDL Ratio: 4.8 ratio units — ABNORMAL HIGH (ref 0.0–4.4)
Cholesterol, Total: 145 mg/dL (ref 100–199)
HDL: 30 mg/dL — ABNORMAL LOW (ref >39)
LDL Calculated: 94 mg/dL (ref 0–99)
Triglycerides: 107 mg/dL (ref 0–149)
VLDL Cholesterol Cal: 21 mg/dL (ref 5–40)

## 2016-05-18 LAB — CBC
Hematocrit: 30.8 % — ABNORMAL LOW (ref 34.0–46.6)
Hemoglobin: 9.4 g/dL — ABNORMAL LOW (ref 11.1–15.9)
MCH: 19.5 pg — ABNORMAL LOW (ref 26.6–33.0)
MCHC: 30.5 g/dL — ABNORMAL LOW (ref 31.5–35.7)
MCV: 64 fL — ABNORMAL LOW (ref 79–97)
Platelets: 324 x10E3/uL (ref 150–379)
RBC: 4.82 x10E6/uL (ref 3.77–5.28)
RDW: 18.4 % — ABNORMAL HIGH (ref 12.3–15.4)
WBC: 9.6 x10E3/uL (ref 3.4–10.8)

## 2016-05-18 MED ORDER — FERROUS SULFATE 325 (65 FE) MG PO TBEC: 325.0000 mg | DELAYED_RELEASE_TABLET | Freq: Two times a day (BID) | ORAL | 2 refills | Status: DC

## 2016-05-18 MED ORDER — RANITIDINE HCL 150 MG PO TABS: 150.0000 mg | ORAL_TABLET | Freq: Two times a day (BID) | ORAL | 1 refills | Status: DC

## 2016-05-18 NOTE — Assessment & Plan Note (Signed)
HbA1c 9.1 today. She reports last check it was 11.2. Endorses very poor diet - eats large quantities of rice, other carbs, greasy foods, and drinks 2-3 [place] sodas daily.   Plan: - Strongly encouraged dietary modification - Ambulatory referral for nutrition and diabetes education - Continue Metformin 1000 BID - Increase Glimepiride to 4 mg daily  - Recheck A1c in 3 months

## 2016-05-18 NOTE — Assessment & Plan Note (Signed)
Reports feeling very depressed and anxious recently, endorses early-morning waking, anxious about her father and his health, not having much family/friends support nearby. Often feels isolated due to blindness and language barrier. Suspect some combination of GAD/depression  Plan: - Start Zoloft 50 mg daily - Investigate further at next visit

## 2016-05-18 NOTE — Assessment & Plan Note (Signed)
Initial BP 151/82 and 139/74 on recheck. Goal SBP <130/80 given diabetes. Patient endorsing very poor diet and eats sweet, salty, spicy food very often.  Plan: - Encouraged dietary modification, exercise - Continue Lisinopril-HCTZ 10-12.5 daily, if remains hypertensive at next visit can increase to 20-25

## 2016-05-18 NOTE — Assessment & Plan Note (Addendum)
Reports history of iron deficiency anemia in the past and requiring oral iron supplementation, reports having "bleeding hemorrhoids" in the past but denies symptoms of such currently. Also reports consistently having abdominal pain before and after meals. Abdomen non-tender to palpation.  Plan: - Check CBC, ferritin - Hb 9.4 with low MCV and ferritin low consistent with IDA - Start Ferrous sulfate 325 mg BID - Start Ranitidine 150 mg BID for possible PUD - Investigate GI/GYN symptoms further at next visit

## 2016-05-18 NOTE — Assessment & Plan Note (Addendum)
Reports vague abdominal pain before and after meals - unable to fully characterize at this time. Non-tender to palpation on exam today. PUD vs cholelithiasis vs (?).   Plan: - CMP, CBC today - Has iron deficiency anemia of unknown etiology - Starting H2 blocker for possible PUD - Investigate further at next visit

## 2016-05-18 NOTE — Assessment & Plan Note (Signed)
Provided flu vaccine. Screening lipid panel collected in patient with DM and HTN.  Lipid Panel     Component Value Date/Time   CHOL 145 05/17/2016 1513   TRIG 107 05/17/2016 1513   HDL 30 (L) 05/17/2016 1513   CHOLHDL 4.8 (H) 05/17/2016 1513   LDLCALC 94 05/17/2016 1513

## 2016-05-22 ENCOUNTER — Telehealth: Payer: Self-pay | Admitting: Pharmacist

## 2016-05-22 NOTE — Progress Notes (Signed)
Calling patient to help with medications, unable to reach.

## 2016-05-22 NOTE — Progress Notes (Signed)
Internal Medicine Clinic Attending  I saw and evaluated the patient.  I personally confirmed the key portions of the history and exam documented by Dr. Johnson and I reviewed pertinent patient test results.  The assessment, diagnosis, and plan were formulated together and I agree with the documentation in the resident's note.  

## 2016-06-13 ENCOUNTER — Telehealth: Payer: Self-pay | Admitting: General Practice

## 2016-06-13 NOTE — Telephone Encounter (Signed)
APT. REMINDER CALL, LMTCB °

## 2016-06-14 ENCOUNTER — Ambulatory Visit (INDEPENDENT_AMBULATORY_CARE_PROVIDER_SITE_OTHER): Payer: Medicare Other | Admitting: Internal Medicine

## 2016-06-14 ENCOUNTER — Ambulatory Visit (INDEPENDENT_AMBULATORY_CARE_PROVIDER_SITE_OTHER): Payer: Medicare Other | Admitting: Dietician

## 2016-06-14 VITALS — BP 138/80 | HR 84 | Temp 97.6°F | Wt 283.7 lb

## 2016-06-14 DIAGNOSIS — Z0000 Encounter for general adult medical examination without abnormal findings: Secondary | ICD-10-CM

## 2016-06-14 DIAGNOSIS — D509 Iron deficiency anemia, unspecified: Secondary | ICD-10-CM

## 2016-06-14 DIAGNOSIS — F17210 Nicotine dependence, cigarettes, uncomplicated: Secondary | ICD-10-CM

## 2016-06-14 DIAGNOSIS — Z6839 Body mass index (BMI) 39.0-39.9, adult: Secondary | ICD-10-CM | POA: Diagnosis not present

## 2016-06-14 DIAGNOSIS — R1013 Epigastric pain: Secondary | ICD-10-CM | POA: Diagnosis not present

## 2016-06-14 DIAGNOSIS — Z7984 Long term (current) use of oral hypoglycemic drugs: Secondary | ICD-10-CM

## 2016-06-14 DIAGNOSIS — I10 Essential (primary) hypertension: Secondary | ICD-10-CM

## 2016-06-14 DIAGNOSIS — Z23 Encounter for immunization: Secondary | ICD-10-CM

## 2016-06-14 DIAGNOSIS — E118 Type 2 diabetes mellitus with unspecified complications: Secondary | ICD-10-CM | POA: Diagnosis not present

## 2016-06-14 DIAGNOSIS — F32A Depression, unspecified: Secondary | ICD-10-CM

## 2016-06-14 DIAGNOSIS — R109 Unspecified abdominal pain: Secondary | ICD-10-CM

## 2016-06-14 DIAGNOSIS — Z79899 Other long term (current) drug therapy: Secondary | ICD-10-CM

## 2016-06-14 DIAGNOSIS — F329 Major depressive disorder, single episode, unspecified: Secondary | ICD-10-CM | POA: Diagnosis not present

## 2016-06-14 DIAGNOSIS — Z713 Dietary counseling and surveillance: Secondary | ICD-10-CM

## 2016-06-14 MED ORDER — RANITIDINE HCL 150 MG PO TABS: 150.0000 mg | ORAL_TABLET | Freq: Two times a day (BID) | ORAL | 1 refills | Status: DC

## 2016-06-14 MED ORDER — FERROUS SULFATE 325 (65 FE) MG PO TBEC: 325.0000 mg | DELAYED_RELEASE_TABLET | Freq: Every day | ORAL | 2 refills | Status: DC

## 2016-06-14 MED ORDER — METFORMIN HCL 1000 MG PO TABS: 1000.0000 mg | ORAL_TABLET | Freq: Two times a day (BID) | ORAL | 1 refills | Status: DC

## 2016-06-14 NOTE — Assessment & Plan Note (Signed)
Hgb low at 9.4, MCV 64 and ferritin low at 10. Unconfirmed/unclear source but she does endorse longstanding history of heavy and irregular menstrual periods. She reports seeing a gynecologist in the past and tried OCPs but this made her depressed. Had pelvic/transvaginal ultrasound in 2013 that showed normal uterus and ovaries - no fibroids or PCOS. She has no symptoms of anemia, reports improved energy recently since starting SSRI. Content to take iron supplementation now, prefers not to see gyn again.  Plan: - Ferrous sulfate 325 mg daily - Reassess CBC/ferritin in 2-3 months

## 2016-06-14 NOTE — Assessment & Plan Note (Addendum)
Continues to endorse intermittent abdominal pain - now described it as epigastric only after meals containing spicy foods. Suspect GERD vs gastritis. She is from [place] so H. Pylori-associated PUD is a consideration. She does have iron deficiency anemia but history of menorrhagia as well. She did not start taking H2 blocker that was ordered shortly after last visit.  Plan: - Advised to start taking Zantac BID for GERD symptoms, assess for resolution at next visit - Consider H pylori IgG or stool antigen test in the future
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
## 2016-06-14 NOTE — Patient Instructions (Addendum)
Please continue taking all your medications as prescribed.  We started two new medications at your last visit - iron tablets and a medication for reflux called Pepcid. Make sure to pick these up from your pharmacy and start taking them one to two times daily.  Continue to eat a healthy diet and strive to lose weight.  We will see you in two months to check up on your diabetes.  We are glad that you are feeling so much better! -----   Contine tomando todos sus medicamentos segn lo recetado.  Comenzamos dos medicamentos nuevos en su ltima visita: tabletas de hierro y un medicamento para el reflujo llamado Pepcid. Asegrese de recogerlos en su farmacia y comience a tomarlos una o Games developer.  Contine comiendo una dieta saludable y esfurcese por perder peso.  Lo veremos dentro de [employer] para verificar su diabetes.  Estamos contentos de que te sientas mucho mejor!

## 2016-06-14 NOTE — Assessment & Plan Note (Signed)
Reports significant improvement in mood, quality of sleep, and energy level since starting Zoloft 4 weeks ago.  Plan: - Continue Zoloft 50 mg daily

## 2016-06-14 NOTE — Patient Instructions (Addendum)
Por favor toca su metformin con desayuno y [place].   por favor lleve su medidor de azcar en la sangre a todas las visitas.  Su proveedor de Recruitment consultant uno o ms programas de Emmi. Estos programas le brindan informacin fcil de entender sobre su salud cuando ms la necesita.    Dear Samantha Petersen,    GO TO THIS SITE:     [email or web address]       USE THIS CODE: [id number]  PLEASE VIEW BY       2018 FEBRUARY 4

## 2016-06-14 NOTE — Assessment & Plan Note (Addendum)
BP 138/80 today, near goal <130/80.   - Encouraged continued dietary modification, exercise, and weight loss. - Continue Lisinopril-HCTZ 10-12.5 mg daily

## 2016-06-14 NOTE — Assessment & Plan Note (Signed)
Provided pneumococcal 23-valent vaccine
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
## 2016-06-18 NOTE — Progress Notes (Signed)
Internal Medicine Clinic Attending  Case discussed with Dr. Johnson at the time of the visit.  We reviewed the resident's history and exam and pertinent patient test results.  I agree with the assessment, diagnosis, and plan of care documented in the resident's note.  

## 2016-07-03 ENCOUNTER — Other Ambulatory Visit: Payer: Self-pay | Admitting: Internal Medicine

## 2016-08-11 ENCOUNTER — Other Ambulatory Visit: Payer: Self-pay | Admitting: Internal Medicine

## 2016-08-11 DIAGNOSIS — E118 Type 2 diabetes mellitus with unspecified complications: Secondary | ICD-10-CM

## 2016-08-15 ENCOUNTER — Telehealth: Payer: Self-pay | Admitting: Internal Medicine

## 2016-08-15 NOTE — Telephone Encounter (Signed)
APT. REMINDER CALL, NO ANSWER, NO VOICEMAIL °

## 2016-08-16 ENCOUNTER — Ambulatory Visit (INDEPENDENT_AMBULATORY_CARE_PROVIDER_SITE_OTHER): Payer: Medicare Other | Admitting: Internal Medicine

## 2016-08-16 ENCOUNTER — Encounter: Payer: Self-pay | Admitting: Internal Medicine

## 2016-08-16 VITALS — BP 140/82 | HR 88 | Temp 97.8°F | Ht 73.0 in | Wt 284.0 lb

## 2016-08-16 DIAGNOSIS — D509 Iron deficiency anemia, unspecified: Secondary | ICD-10-CM

## 2016-08-16 DIAGNOSIS — E119 Type 2 diabetes mellitus without complications: Secondary | ICD-10-CM | POA: Diagnosis not present

## 2016-08-16 DIAGNOSIS — Z23 Encounter for immunization: Secondary | ICD-10-CM | POA: Diagnosis not present

## 2016-08-16 DIAGNOSIS — N946 Dysmenorrhea, unspecified: Secondary | ICD-10-CM | POA: Insufficient documentation

## 2016-08-16 DIAGNOSIS — Z0000 Encounter for general adult medical examination without abnormal findings: Secondary | ICD-10-CM

## 2016-08-16 DIAGNOSIS — L97319 Non-pressure chronic ulcer of right ankle with unspecified severity: Secondary | ICD-10-CM

## 2016-08-16 DIAGNOSIS — E669 Obesity, unspecified: Secondary | ICD-10-CM | POA: Diagnosis not present

## 2016-08-16 DIAGNOSIS — Z872 Personal history of diseases of the skin and subcutaneous tissue: Secondary | ICD-10-CM

## 2016-08-16 DIAGNOSIS — I83013 Varicose veins of right lower extremity with ulcer of ankle: Secondary | ICD-10-CM

## 2016-08-16 DIAGNOSIS — E509 Vitamin A deficiency, unspecified: Secondary | ICD-10-CM | POA: Diagnosis not present

## 2016-08-16 DIAGNOSIS — Z7984 Long term (current) use of oral hypoglycemic drugs: Secondary | ICD-10-CM | POA: Diagnosis not present

## 2016-08-16 DIAGNOSIS — E118 Type 2 diabetes mellitus with unspecified complications: Secondary | ICD-10-CM

## 2016-08-16 DIAGNOSIS — K429 Umbilical hernia without obstruction or gangrene: Secondary | ICD-10-CM

## 2016-08-16 DIAGNOSIS — Z8679 Personal history of other diseases of the circulatory system: Secondary | ICD-10-CM

## 2016-08-16 DIAGNOSIS — K439 Ventral hernia without obstruction or gangrene: Secondary | ICD-10-CM | POA: Insufficient documentation

## 2016-08-16 DIAGNOSIS — Z6837 Body mass index (BMI) 37.0-37.9, adult: Secondary | ICD-10-CM

## 2016-08-16 DIAGNOSIS — G4700 Insomnia, unspecified: Secondary | ICD-10-CM | POA: Insufficient documentation

## 2016-08-16 LAB — CBC
HEMATOCRIT: 41 % (ref 36.0–46.0)
Hemoglobin: 13.4 g/dL (ref 12.0–15.0)
MCH: 23.9 pg — ABNORMAL LOW (ref 26.0–34.0)
MCHC: 32.7 g/dL (ref 30.0–36.0)
MCV: 73.1 fL — ABNORMAL LOW (ref 78.0–100.0)
Platelets: 304 10*3/uL (ref 150–400)
RBC: 5.61 MIL/uL — ABNORMAL HIGH (ref 3.87–5.11)
RDW: 18.5 % — AB (ref 11.5–15.5)
WBC: 11.1 10*3/uL — ABNORMAL HIGH (ref 4.0–10.5)

## 2016-08-16 LAB — GLUCOSE, CAPILLARY: Glucose-Capillary: 133 mg/dL — ABNORMAL HIGH (ref 65–99)

## 2016-08-16 LAB — POCT GLYCOSYLATED HEMOGLOBIN (HGB A1C): Hemoglobin A1C: 6.4

## 2016-08-16 NOTE — Assessment & Plan Note (Signed)
Reports irregular periods all her life. She states this gives her inconsistent heavy periods that have resulted in anemia in the past. She is obese and has some subtle hirsutism on exam that makes PCOS a strong possibility. She is interested in seeing a gynecologist to address this. Also needs pap smear.  Plan: - Ambulatory referral to gynecology - Check TSH, FSH, prolactin, serum beta HCG

## 2016-08-16 NOTE — Assessment & Plan Note (Signed)
She reports daytime "sleepiness" however states that her energy is good, denies dyspnea or dizziness. No pallor on exam. She is concerned that this is from her anemia.  Plan: - Check CBC today - Hb 13.4, MCV 73 much improved - Continue iron tablets

## 2016-08-16 NOTE — Assessment & Plan Note (Signed)
Reports daytime sleepiness, falls asleep at work unintentionally. Denies snoring, waking with headache, fatigue, however is obese and at risk for OSA.  Plan: - Provided sleep hygiene recommendations - Advised to try Melatonin OTC at night

## 2016-08-16 NOTE — Patient Instructions (Addendum)
Good job on getting your diabetes under control.  Please return in 3 months!  We will call if any of your lab work is abnormal.  Please stop taking the Zantac.  To help with sleep you can try taking Melatonin, an over the counter medication.   Practice Good Sleep Hygiene Here are some suggestions Avoid napping during the day. It can disturb the normal pattern of sleep and wakefulness.  Avoid stimulants such as caffeine, nicotine, and alcohol too close to bedtime. While alcohol is well known to speed the onset of sleep, it disrupts sleep in the second half as the body begins to metabolize the alcohol, causing arousal.  Exercise can promote good sleep. Vigorous exercise should be taken in the morning or late afternoon. A relaxing exercise, like yoga, can be done before bed to help initiate a restful night's sleep. Food can be disruptive right before sleep. Stay away from large meals close to bedtime. Also dietary changes can cause sleep problems, if someone is struggling with a sleep problem, it's not a good time to start experimenting with spicy dishes. And, remember, chocolate has caffeine.  Ensure adequate exposure to natural light. This is particularly important for older people who may not venture outside as frequently as children and adults. Light exposure helps maintain a healthy sleep-wake cycle.  Establish a regular relaxing bedtime routine. Try to avoid emotionally upsetting conversations and activities before trying to go to sleep. Don't dwell on, or bring your problems to bed.  Associate your bed with sleep. It's not a good idea to use your bed to watch TV, listen to the radio, or read.  Make sure that the sleep environment is pleasant and relaxing. The bed should be comfortable, the room should not be too hot or cold, or too bright.  ------  Samantha Petersen para controlar tu diabetes.  Por favor regrese en 3 meses!  Llamaremos si alguno de sus anlisis de laboratorio es  anormal.  Por favor deja de tomar el Zantac.  Para ayudar con el sueo, puede intentar tomar melatonina, un medicamento sin receta.  Practique la buena higiene del sueo Aqu hay algunas sugerencias Evite dormir la siesta [place]. Puede alterar el patrn normal de sueo y vigilia. Evite los estimulantes como la cafena, la nicotina y el alcohol demasiado cerca de la hora de [place]. Mientras que el alcohol es bien conocido para acelerar el inicio del sueo, interrumpe el sueo en la segunda mitad a medida que el cuerpo comienza a Hospital doctor el alcohol, causando Programmer, systems. El ejercicio puede promover un buen sueo. Se debe realizar ejercicio vigoroso por la maana o al final de la tarde. Se puede realizar un ejercicio relajante, como yoga, antes de acostarse para ayudar a iniciar una noche de sueo reparador. La comida puede ser perjudicial justo antes de dormir. Mantngase alejado de las comidas grandes cerca de la hora de [place]. Adems, los cambios en la dieta pueden causar problemas para dormir, si alguien est luchando con un problema de sueo, no es un buen momento para comenzar a experimentar con platos picantes. Y recuerde, el chocolate tiene cafena. Asegure una exposicin adecuada a la luz natural. Esto es particularmente importante para las Special educational needs teacher que no pueden aventurarse al exterior con tanta frecuencia como los nios y los adultos. La exposicin a la luz ayuda a Theatre manager un ciclo de sueo y vigilia saludable. Establezca una rutina regular de descanso a la hora de [place]. Intente evitar las conversaciones y [place] emocionalmente  molestas antes de intentar irse a dormir. No te preocupes ni traigas tus problemas a la cama.  Asocia tu cama con el sueo. No es una buena idea usar la cama para mirar televisin, Visual merchandiser radio o leer. Asegrese de que el ambiente de sueo sea placentero y [place]. La cama debe ser cmoda, la habitacin no debe estar  demasiado caliente o fra, ni demasiado brillante.
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
## 2016-08-16 NOTE — Assessment & Plan Note (Signed)
HbA1c 6.4 from 9.1 last check. Greatly improved. Congratulated her on this. She stated she has been taking her medicine and trying to eat healthier sometimes.   Plan: - Continue Metformin 1000 mg BID - Continue Glimepiride 4 mg daily - Recheck A1c in 3 months

## 2016-08-16 NOTE — Assessment & Plan Note (Signed)
History of ulcer over right ankle, well healed now. Wears compression stockings at work. No longer stands all day at work but sits in a chair. States that her compression stockings no longer feel tight.  Plan: - Ordered compression stocking, advised she may be able to find them at pharmacy / OTC

## 2016-08-16 NOTE — Assessment & Plan Note (Signed)
Epigastric pain turned out to be from supra-umbilical lump / reducible hernia that has been present for years. Causes pain with waistband/belt and she finds it distressing. Her father states that many relatives have had similar hernias. She is concerned that it will strangulate. Feels small (approx 4-6 cm) on exam, reducible, hard to palpate due to obese stomach. Pain improves with Tylenol or Advil.   Plan: - Observe for now - May require referral to gen surg in future for hernia eval and repair

## 2016-08-16 NOTE — Assessment & Plan Note (Signed)
Provided Tdap today. Will defer pap smear to gyn referral provider

## 2016-08-21 LAB — TSH: TSH: 1.5 u[IU]/mL (ref 0.450–4.500)

## 2016-08-21 LAB — 17-HYDROXYPROGESTERONE: 17 HYDROXYPROGESTERONE: 20 ng/dL

## 2016-08-21 LAB — FOLLICLE STIMULATING HORMONE: FSH: 6.2 m[IU]/mL

## 2016-08-21 LAB — PROLACTIN: PROLACTIN: 14.8 ng/mL (ref 4.8–23.3)

## 2016-08-21 LAB — HCG, SERUM, QUALITATIVE: hCG,Beta Subunit,Qual,Serum: NEGATIVE m[IU]/mL (ref <6)

## 2016-08-21 NOTE — Progress Notes (Signed)
Internal Medicine Clinic Attending  I saw and evaluated the patient.  I personally confirmed the key portions of the history and exam documented by Dr. Johnson and I reviewed pertinent patient test results.  The assessment, diagnosis, and plan were formulated together and I agree with the documentation in the resident's note.  

## 2016-09-05 ENCOUNTER — Other Ambulatory Visit: Payer: Self-pay | Admitting: Internal Medicine

## 2016-09-05 DIAGNOSIS — D509 Iron deficiency anemia, unspecified: Secondary | ICD-10-CM

## 2016-09-05 DIAGNOSIS — R109 Unspecified abdominal pain: Secondary | ICD-10-CM

## 2016-09-24 ENCOUNTER — Encounter: Payer: Medicare Other | Admitting: Obstetrics and Gynecology

## 2016-09-24 ENCOUNTER — Encounter: Payer: Medicare Other | Admitting: Family Medicine

## 2016-10-02 ENCOUNTER — Telehealth: Payer: Self-pay

## 2016-10-02 DIAGNOSIS — E118 Type 2 diabetes mellitus with unspecified complications: Secondary | ICD-10-CM

## 2016-10-02 NOTE — Telephone Encounter (Addendum)
Pharmacy requesting 90 day supply glimepiride 2 mg tab and rantidine 150 mg

## 2016-10-03 MED ORDER — GLIMEPIRIDE 2 MG PO TABS: ORAL_TABLET | ORAL | 3 refills | Status: DC

## 2016-10-07 ENCOUNTER — Other Ambulatory Visit (HOSPITAL_COMMUNITY)
Admission: RE | Admit: 2016-10-07 | Discharge: 2016-10-07 | Disposition: A | Discharge status: Home / Self Care | Payer: Medicare Other | Source: Ambulatory Visit | Attending: Obstetrics and Gynecology | Admitting: Obstetrics and Gynecology

## 2016-10-07 ENCOUNTER — Ambulatory Visit (INDEPENDENT_AMBULATORY_CARE_PROVIDER_SITE_OTHER): Payer: Medicare Other | Admitting: Obstetrics and Gynecology

## 2016-10-07 ENCOUNTER — Encounter: Payer: Self-pay | Admitting: Obstetrics and Gynecology

## 2016-10-07 VITALS — BP 140/87 | HR 89 | Wt 281.5 lb

## 2016-10-07 DIAGNOSIS — N926 Irregular menstruation, unspecified: Secondary | ICD-10-CM | POA: Diagnosis not present

## 2016-10-07 DIAGNOSIS — Z124 Encounter for screening for malignant neoplasm of cervix: Secondary | ICD-10-CM | POA: Diagnosis not present

## 2016-10-07 DIAGNOSIS — N946 Dysmenorrhea, unspecified: Secondary | ICD-10-CM

## 2016-10-07 DIAGNOSIS — Z1151 Encounter for screening for human papillomavirus (HPV): Secondary | ICD-10-CM

## 2016-10-07 NOTE — Progress Notes (Signed)
Video Interpreter # [id number] [place] scheduled for May 14th @ 1345.  Pt notified.   Samantha Petersen is 41 yo nulligravid who presents for evaluation for DUB and dysmenorrhea.  Pt reports problems with her cycle since 2010. She will skip months at a time and when she bleeds cycles can last for 10 days or more. LMP was in Jan.  She was evaluated for this same problem here in the clinic back in 2013. Normal U/S. Given Provera. Pt states she had cycle with medication and a few months afterward but then the cycles began irregular again. She did not follow up.   H/O IDA but last H & H was normal. She also has a H/O HTN and DM. She is blind  Sexual active without contraception. Unknown last pap smear  PE AF VSS Lungs Clear  Heart RRR  Abd soft + BS obese GU nl EGBUS cervix no lesion (pap smear obtained)       Uterus small mobile no masses  A/P DUB        Dysmenorrhea  Will check GYN U/S. Follow up after U/S. Possible EMBX at next visit as well

## 2016-10-09 LAB — CYTOLOGY - PAP
Diagnosis: NEGATIVE
HPV: DETECTED — AB

## 2016-10-11 ENCOUNTER — Ambulatory Visit (HOSPITAL_COMMUNITY): Payer: Medicare Other

## 2016-10-25 ENCOUNTER — Ambulatory Visit (INDEPENDENT_AMBULATORY_CARE_PROVIDER_SITE_OTHER): Payer: Medicare Other | Admitting: Obstetrics & Gynecology

## 2016-10-25 ENCOUNTER — Ambulatory Visit (HOSPITAL_COMMUNITY)
Admission: RE | Admit: 2016-10-25 | Discharge: 2016-10-25 | Disposition: A | Discharge status: Home / Self Care | Payer: Medicare Other | Source: Ambulatory Visit | Attending: Obstetrics and Gynecology | Admitting: Obstetrics and Gynecology

## 2016-10-25 ENCOUNTER — Ambulatory Visit: Payer: Medicare Other | Admitting: Obstetrics & Gynecology

## 2016-10-25 ENCOUNTER — Encounter: Payer: Self-pay | Admitting: Obstetrics & Gynecology

## 2016-10-25 VITALS — BP 146/82 | HR 90 | Ht 73.0 in | Wt 277.0 lb

## 2016-10-25 DIAGNOSIS — N83291 Other ovarian cyst, right side: Secondary | ICD-10-CM | POA: Insufficient documentation

## 2016-10-25 DIAGNOSIS — N854 Malposition of uterus: Secondary | ICD-10-CM | POA: Insufficient documentation

## 2016-10-25 DIAGNOSIS — N946 Dysmenorrhea, unspecified: Secondary | ICD-10-CM | POA: Diagnosis not present

## 2016-10-25 DIAGNOSIS — N926 Irregular menstruation, unspecified: Secondary | ICD-10-CM | POA: Diagnosis not present

## 2016-10-25 NOTE — Progress Notes (Signed)
Patient here today for follow up appt & requested to see female provider. Patient states she missed her first ultrasound appt due to transportation not picking her up & the appt was reschedule for today. Discussed with patient rescheduling this appt because without the ultrasound there isn't much we can do today. Patient was agreeable and will go to ultrasound appt today & will reschedule this appt. Patient asked about pap results. Informed patient of pap results & need for repeat pap in one year per Dr Rip Harbour. Patient verbalized understanding to all & had no other questions. Patient was not seen by the doctor, only nurse visit.

## 2016-10-28 ENCOUNTER — Telehealth: Payer: Self-pay | Admitting: *Deleted

## 2016-10-28 DIAGNOSIS — N83209 Unspecified ovarian cyst, unspecified side: Secondary | ICD-10-CM

## 2016-10-28 NOTE — Telephone Encounter (Signed)
-----   Message from Chancy Milroy, MD sent at 10/26/2016  3:52 PM EDT ----- Pt needs f/u U/S in 6-8 weeks for ovarian cyst. Can start the pt on OCP's  Or Provera to help regulate cycles if desires. Thanks Legrand Como

## 2016-10-28 NOTE — Progress Notes (Signed)
Patient not seen as she has not had her u/s yet.

## 2016-10-28 NOTE — Telephone Encounter (Signed)
Sent message to Dr. Rip Harbour to clarify if needs to keep 6/1 appt and if [place] should be later.

## 2016-10-30 ENCOUNTER — Other Ambulatory Visit: Payer: Self-pay

## 2016-10-30 MED ORDER — PRODIGY TWIST TOP LANCETS 28G MISC: 2 refills | Status: DC

## 2016-10-30 NOTE — Telephone Encounter (Signed)
Tyrin from [facility] pharmacy requesting Prodigy test strips to be filled. Please call back.

## 2016-10-31 ENCOUNTER — Other Ambulatory Visit: Payer: Self-pay | Admitting: *Deleted

## 2016-10-31 MED ORDER — GLUCOSE BLOOD VI STRP: ORAL_STRIP | 2 refills | Status: DC

## 2016-10-31 NOTE — Telephone Encounter (Signed)
Requesting Prodigy No Coding test strips.Plesae include directions. Thanks

## 2016-11-05 NOTE — Telephone Encounter (Signed)
This has been done, filled 5/24

## 2016-11-07 NOTE — Telephone Encounter (Signed)
Called Samantha Petersen with [employer] [phone] and left a message we are calling to remind you of your appointment tomorrow at 11:00 and to discuss another appointment we have made. Please call our office .  Will place notes on appt.

## 2016-11-08 ENCOUNTER — Encounter: Payer: Self-pay | Admitting: Obstetrics & Gynecology

## 2016-11-08 ENCOUNTER — Ambulatory Visit (INDEPENDENT_AMBULATORY_CARE_PROVIDER_SITE_OTHER): Payer: Medicare Other | Admitting: Obstetrics & Gynecology

## 2016-11-08 ENCOUNTER — Other Ambulatory Visit: Payer: Self-pay | Admitting: Obstetrics & Gynecology

## 2016-11-08 VITALS — BP 150/78 | HR 91 | Wt 280.1 lb

## 2016-11-08 DIAGNOSIS — N63 Unspecified lump in unspecified breast: Secondary | ICD-10-CM

## 2016-11-08 DIAGNOSIS — N83201 Unspecified ovarian cyst, right side: Secondary | ICD-10-CM

## 2016-11-08 NOTE — Progress Notes (Signed)
   Subjective:    Patient ID: Samantha Petersen, female    DOB: 11-18-1975, 41 y.o.   MRN: [id number]  HPI 41 yo "married" HP0 here for follow up after her gyn u/s done for irregular periods. She had seen Dr. Rip Harbour last month and reported that she skips up to 6 periods per year. She also has some pelvic pain. The u/s showed a normal uterus with a 29mm lining. There is a 6 cm simple right ovarian cyst.  She reports that she feels a small left breast mass. Denies nipple discharge.  She would like a pregnancy but has never conceived, does not use contraception. She is a smoker.  Review of Systems     Objective:   Physical Exam Tall pleasant HFNAD Breathing, conversing, and ambulating normally (although she uses a cane due to her blindness) Very small hard lump next to her left nipple at the 3 o'clock position (about 3 mm) Abd- benign     Assessment & Plan:  Left breast lump- schedule diagnostic mammogram Oligomenorrhea, smoker- prescribe provera 10 mg days 1-10 of each month RTC 4 months (She will get a follow up u/s in 3 months)

## 2016-11-08 NOTE — Progress Notes (Signed)
Spanish Interpreter Laural Golden Dx Mammogram scheduled for June 15th @ 0800 @ [facility].  Pt notified.

## 2016-11-11 NOTE — Telephone Encounter (Signed)
Patient saw Dr. Hulan Fray 11/08/16 and was rx provera

## 2016-11-22 ENCOUNTER — Other Ambulatory Visit: Payer: Medicare Other

## 2016-11-25 NOTE — Telephone Encounter (Signed)
Saw Dr. Hulan Fray 11/08/16 and has follow up ordered

## 2016-12-12 ENCOUNTER — Ambulatory Visit (HOSPITAL_COMMUNITY): Admission: RE | Admit: 2016-12-12 | Payer: Medicare Other | Source: Ambulatory Visit

## 2017-01-01 ENCOUNTER — Other Ambulatory Visit: Payer: Self-pay | Admitting: *Deleted

## 2017-01-01 MED ORDER — LISINOPRIL-HYDROCHLOROTHIAZIDE 10-12.5 MG PO TABS: 1.0000 | ORAL_TABLET | Freq: Every day | ORAL | 3 refills | Status: DC

## 2017-01-01 NOTE — Telephone Encounter (Signed)
appt 01/13/2017 with pcp

## 2017-01-13 ENCOUNTER — Ambulatory Visit (INDEPENDENT_AMBULATORY_CARE_PROVIDER_SITE_OTHER): Payer: Medicare Other | Admitting: Internal Medicine

## 2017-01-13 VITALS — BP 160/85 | HR 92 | Temp 98.5°F | Wt 291.9 lb

## 2017-01-13 DIAGNOSIS — L821 Other seborrheic keratosis: Secondary | ICD-10-CM | POA: Insufficient documentation

## 2017-01-13 DIAGNOSIS — I10 Essential (primary) hypertension: Secondary | ICD-10-CM | POA: Diagnosis not present

## 2017-01-13 DIAGNOSIS — Z7984 Long term (current) use of oral hypoglycemic drugs: Secondary | ICD-10-CM

## 2017-01-13 DIAGNOSIS — R110 Nausea: Secondary | ICD-10-CM

## 2017-01-13 DIAGNOSIS — L989 Disorder of the skin and subcutaneous tissue, unspecified: Secondary | ICD-10-CM

## 2017-01-13 DIAGNOSIS — K429 Umbilical hernia without obstruction or gangrene: Secondary | ICD-10-CM | POA: Diagnosis not present

## 2017-01-13 DIAGNOSIS — L988 Other specified disorders of the skin and subcutaneous tissue: Secondary | ICD-10-CM

## 2017-01-13 DIAGNOSIS — Z79899 Other long term (current) drug therapy: Secondary | ICD-10-CM | POA: Diagnosis not present

## 2017-01-13 DIAGNOSIS — E669 Obesity, unspecified: Secondary | ICD-10-CM | POA: Diagnosis not present

## 2017-01-13 DIAGNOSIS — H543 Unqualified visual loss, both eyes: Secondary | ICD-10-CM | POA: Diagnosis not present

## 2017-01-13 DIAGNOSIS — F329 Major depressive disorder, single episode, unspecified: Secondary | ICD-10-CM

## 2017-01-13 DIAGNOSIS — N649 Disorder of breast, unspecified: Secondary | ICD-10-CM

## 2017-01-13 DIAGNOSIS — F17210 Nicotine dependence, cigarettes, uncomplicated: Secondary | ICD-10-CM | POA: Diagnosis not present

## 2017-01-13 DIAGNOSIS — E118 Type 2 diabetes mellitus with unspecified complications: Secondary | ICD-10-CM

## 2017-01-13 DIAGNOSIS — E1165 Type 2 diabetes mellitus with hyperglycemia: Secondary | ICD-10-CM

## 2017-01-13 LAB — POCT GLYCOSYLATED HEMOGLOBIN (HGB A1C): HEMOGLOBIN A1C: 8.2

## 2017-01-13 LAB — GLUCOSE, CAPILLARY: GLUCOSE-CAPILLARY: 131 mg/dL — AB (ref 65–99)

## 2017-01-13 MED ORDER — SENNOSIDES-DOCUSATE SODIUM 8.6-50 MG PO TABS: 1.0000 | ORAL_TABLET | Freq: Every day | ORAL | 1 refills | Status: DC

## 2017-01-13 MED ORDER — SITAGLIPTIN PHOSPHATE 100 MG PO TABS: 100.0000 mg | ORAL_TABLET | Freq: Every day | ORAL | 3 refills | Status: DC

## 2017-01-13 MED ORDER — ATORVASTATIN CALCIUM 80 MG PO TABS: 80.0000 mg | ORAL_TABLET | Freq: Every day | ORAL | 11 refills | Status: DC

## 2017-01-13 NOTE — Assessment & Plan Note (Addendum)
Patient reports post-prandial nausea that started a few weeks ago. States it lasts for ~25s after she eats. She also endorses abdominal fullness and bloating. She also endorses constipation, stating she has several small BMs throughout the day. Denies chest pain, abdominal pain, vomiting, and early satiety. Symptoms are not position dependent. She is on ranitidine at home, which does not provide relief.   Suspect this is secondary to constipation vs. Gastroparesis (hx of uncontrolled T2DM, though A1c only 8.2) vs GERD (on ranitidine at home with no improvement). Unlikely to be infectious given no GI sxs other than nausea.   - Will start Senokot 8.6-50mg  tablet once a day and monitor symptoms. If symptoms persists will do a PPI trial next. Will also consider GES to evaluate for gastroparesis.

## 2017-01-13 NOTE — Assessment & Plan Note (Signed)
Patient reports new skin lesion on the lower outer quadrant of her R breast. States it is not painful or itchy. She has not noticed any bleeding or discharge from it. Unclear if it increasing in size. On exam, she has a 2x3 cm raised, pigmented lesion with a stuck on appearance consistent with seborrheic keratosis.   - Will continue to monitor for now since there are no signs that raise concern for malignancy.

## 2017-01-13 NOTE — Assessment & Plan Note (Signed)
BP 160/85 today. Reports compliance with home lisinopril-HCTZ. On further questioning, patient reports taking the medication sublingually. States Samantha Petersen has never swallowed it.   - BP goal <130/80 - Educated patient on how to appropriately take the medication. Will not make any dose changes at this time. Will continue lisinopril- HCTZ 10-12.5mg  daily. Samantha Petersen will follow up in 4-8 weeks. Will consider increasing lisinopril-HCTZ to 2 tablets if BP remains elevated.

## 2017-01-13 NOTE — Assessment & Plan Note (Signed)
Patient presenting for f/u of T2DM. Reports compliance with metformin and glimepiride at home. Readings from glucometer with AM and PM  BG 131-220s. A1c today 8.2 from 6.4 in 08/2016. Has never had eye exam as patient is blind from both eyes. No foot exam documented in chart or in care everywhere. She states she has never had a foot exam. Patient states she has been struggling with carbohydrate intake on a daily basis and reports eating rice everyday. She has also gained 7 lbs since last seen in 08/2016.   - Will add Januvia 100mg  daily to regimen given hyperglycemia and poorly controlled diabetes. She is not on a maximum dose of  Glimepiride. Can consider increasing it if home BG remains elevated with the addition of januvia. However, there is risk with hypoglycemia with this. Holding on injectable medications for now since A1c<9 and patient is blind. If necessary, victoza would be a good option for her since she is trying to lose weight.  - Will start high intensity statin atorvastatin 80mg  daily given hx of T2DM and ASCVD 15.5% - Encouraged dietary modifications and exercising. Consider diabetes education and nutrition consult at next visit.  - Referral to podiatry for diabetic foot exam per patient's request

## 2017-01-13 NOTE — Patient Instructions (Addendum)
Fue un Armed forces operational officer.   Su Hemoglobina A1c esta en 8.2 hoy. Lo ideal seria menos de 7. Vamos a aadir un medicamento nuevo para su diabetes llamado Januvia. Va a tomarse 1 tableta todos los dias. Continue tomando metformina y glimepiride como siempre.   Va a comenzar a tomar lipitor. Tomese una [employer].   Para el estreimiento, tome Senokot una vez al dia.   Continue tomandose su medicamento de la presion (lisinopril-hydrochlotothiazide) como siempre, una vez al dia. Trague la pastilla para Psychologist, sport and exercise.   La referimos a un podiatra para un examen anual de los pies. Ellos la Lucianne Lei a llamar para sacar una cita.   Cita de seguimiento conmigo en un 4-8 semanas.
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
## 2017-01-16 NOTE — Addendum Note (Signed)
Addended by: Gilles Chiquito B on: 01/16/2017 03:42 PM   Modules accepted: Level of Service

## 2017-01-16 NOTE — Progress Notes (Signed)
Internal Medicine Clinic Attending  I saw and evaluated the patient.  I personally confirmed the key portions of the history and exam documented by Dr. Santos-Sanchez and I reviewed pertinent patient test results.  The assessment, diagnosis, and plan were formulated together and I agree with the documentation in the resident's note. 

## 2017-02-06 ENCOUNTER — Ambulatory Visit
Admission: RE | Admit: 2017-02-06 | Discharge: 2017-02-06 | Disposition: A | Discharge status: Home / Self Care | Payer: Medicare Other | Source: Ambulatory Visit | Attending: Obstetrics & Gynecology | Admitting: Obstetrics & Gynecology

## 2017-02-06 DIAGNOSIS — N63 Unspecified lump in unspecified breast: Secondary | ICD-10-CM

## 2017-03-04 ENCOUNTER — Ambulatory Visit: Payer: Self-pay | Admitting: Podiatry

## 2017-03-24 ENCOUNTER — Other Ambulatory Visit: Payer: Self-pay | Admitting: *Deleted

## 2017-03-25 MED ORDER — METFORMIN HCL 1000 MG PO TABS: 1000.0000 mg | ORAL_TABLET | Freq: Two times a day (BID) | ORAL | 1 refills | Status: DC

## 2017-04-01 ENCOUNTER — Ambulatory Visit: Payer: Self-pay | Admitting: Podiatry

## 2017-04-01 NOTE — Addendum Note (Signed)
Addended by: Hulan Fray on: 04/01/2017 07:05 PM   Modules accepted: Orders

## 2017-07-02 ENCOUNTER — Other Ambulatory Visit: Payer: Self-pay | Admitting: *Deleted

## 2017-07-02 DIAGNOSIS — E118 Type 2 diabetes mellitus with unspecified complications: Secondary | ICD-10-CM

## 2017-07-02 MED ORDER — GLIMEPIRIDE 2 MG PO TABS: ORAL_TABLET | ORAL | 3 refills | Status: DC

## 2017-07-09 ENCOUNTER — Other Ambulatory Visit: Payer: Self-pay | Admitting: *Deleted

## 2017-07-09 MED ORDER — GLUCOSE BLOOD VI STRP: ORAL_STRIP | 11 refills | Status: DC

## 2017-09-14 ENCOUNTER — Other Ambulatory Visit: Payer: Self-pay | Admitting: Internal Medicine

## 2017-10-17 ENCOUNTER — Ambulatory Visit (INDEPENDENT_AMBULATORY_CARE_PROVIDER_SITE_OTHER): Payer: Medicare Other | Admitting: Internal Medicine

## 2017-10-17 ENCOUNTER — Encounter: Payer: Self-pay | Admitting: Internal Medicine

## 2017-10-17 VITALS — BP 137/69 | HR 96 | Temp 97.5°F | Ht 73.0 in | Wt 299.8 lb

## 2017-10-17 DIAGNOSIS — D509 Iron deficiency anemia, unspecified: Secondary | ICD-10-CM | POA: Diagnosis not present

## 2017-10-17 DIAGNOSIS — Z79899 Other long term (current) drug therapy: Secondary | ICD-10-CM | POA: Diagnosis not present

## 2017-10-17 DIAGNOSIS — D242 Benign neoplasm of left breast: Secondary | ICD-10-CM | POA: Insufficient documentation

## 2017-10-17 DIAGNOSIS — E119 Type 2 diabetes mellitus without complications: Secondary | ICD-10-CM | POA: Diagnosis not present

## 2017-10-17 DIAGNOSIS — Z86018 Personal history of other benign neoplasm: Secondary | ICD-10-CM | POA: Diagnosis not present

## 2017-10-17 DIAGNOSIS — I10 Essential (primary) hypertension: Secondary | ICD-10-CM

## 2017-10-17 DIAGNOSIS — N6452 Nipple discharge: Secondary | ICD-10-CM | POA: Diagnosis not present

## 2017-10-17 DIAGNOSIS — K439 Ventral hernia without obstruction or gangrene: Secondary | ICD-10-CM | POA: Diagnosis not present

## 2017-10-17 DIAGNOSIS — Z7984 Long term (current) use of oral hypoglycemic drugs: Secondary | ICD-10-CM | POA: Diagnosis not present

## 2017-10-17 DIAGNOSIS — H548 Legal blindness, as defined in USA: Secondary | ICD-10-CM | POA: Diagnosis not present

## 2017-10-17 DIAGNOSIS — E118 Type 2 diabetes mellitus with unspecified complications: Secondary | ICD-10-CM | POA: Diagnosis not present

## 2017-10-17 LAB — GLUCOSE, CAPILLARY: Glucose-Capillary: 185 mg/dL — ABNORMAL HIGH (ref 65–99)

## 2017-10-17 LAB — POCT GLYCOSYLATED HEMOGLOBIN (HGB A1C): HEMOGLOBIN A1C: 8

## 2017-10-17 NOTE — Patient Instructions (Addendum)
Samantha Petersen,    Fue un gusto verla hoy de nuevo.   Para el liquido que le esta saliendo del seno le voy a Sales executive a Environmental consultant. Tambien vamos a hacerle pruebas de sangre para ver si podemos determinar la cause.   Para su diabetes: Su A1c hoy es 8. Trate de limitar el consumo de arroz y azucares. Le voy a aumentar la dosis del glimepiride a 4  miligramos diarios. Le envie la receta a su farmacia.   Su presion esta bajo muy buen control. Continue tomando su medicamento como siempre.   Para su hernia: vamos a continuar monitoreandola. Si siente fuertes dolores abdominales acompaados de nausea, vomitos o cambios en defecacion llamenos y saque una cita en la clinica para evaluarla.   Si tiene alguna pregunta o duda por favor llamenos.

## 2017-10-17 NOTE — Assessment & Plan Note (Addendum)
Currently on glimepiride 4 mg QD, metformin 1000 mg BID, and Januvia 100 mg QD. Reports compliance with medication and is able to tell me each medication along with the dose. Reports she continues to struggle with her diet. Eating rice and potatoes almost every day. Her weight is up 9 lbs. I offered to start Victoza which would also help with weight loss. Patient prefers to work on diet and exercise before starting an injectable. States she is open to it if A1c has not improved in 3 months   - Continue current regimen - Counseled on weight loss and low carbohydrate diet  - Follow up in 3 months. If A1c still 9 or above will add victoza to regimen and discontinue Januvia

## 2017-10-17 NOTE — Assessment & Plan Note (Signed)
Patient presents complaining of L breast discharge x 1 month. She is unable to describe it as she is blind. States does not believe is blood because it is not sticky. She was found to have a benign peri-areolar L breast cyst 1 year ago on a mammogram. Has not felt new cysts or masses. She also endorses weight gain despite consistent diet. She is up 9 lbs. LMP 09/2016 and normal. She is sexually active and does not practice safe sex. No recent medication changes that could cause this and would expect discharge to be bilateral if medication related. On exam, there is spontaneous serous discharge from L breast. Otherwise no abnormalities on breast exam. Differential extensive including papilloma, pregnancy (would expect bilateral discharge), malignancy, etc. Will order diagnostic mammogram for further evaluation.   - TSH, prolactin, hCG ordered - Diagnostic mammogram of L breast ordered

## 2017-10-17 NOTE — Assessment & Plan Note (Signed)
Well controlled. On lisinopril-HCTZ 10-12.5 mg Qd and compliant. Taking medication appropriately.   - Continue  lisinopril-HCTZ 10-12.5 mg QD - BMP to check renal function

## 2017-10-17 NOTE — Assessment & Plan Note (Signed)
Patient has been on iron since 08/2016. She denies symptoms of anemia and has not noticed any signs of bleeding. No pallor or other signs of anemia on exam.   - Check CBC

## 2017-10-17 NOTE — Assessment & Plan Note (Signed)
Supraumbilical hernia remains stable. Reducible on exam. Denies abdominal pain, N/V, and difficulty eating.   - continue to monitor
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
## 2017-10-18 LAB — TSH: TSH: 0.745 u[IU]/mL (ref 0.450–4.500)

## 2017-10-18 LAB — CBC WITH DIFFERENTIAL/PLATELET
BASOS ABS: 0 10*3/uL (ref 0.0–0.2)
Basos: 0 %
EOS (ABSOLUTE): 0.2 10*3/uL (ref 0.0–0.4)
Eos: 2 %
Hematocrit: 41.4 % (ref 34.0–46.6)
Hemoglobin: 13 g/dL (ref 11.1–15.9)
IMMATURE GRANULOCYTES: 0 %
Immature Grans (Abs): 0 10*3/uL (ref 0.0–0.1)
Lymphocytes Absolute: 2.3 10*3/uL (ref 0.7–3.1)
Lymphs: 21 %
MCH: 24.6 pg — ABNORMAL LOW (ref 26.6–33.0)
MCHC: 31.4 g/dL — ABNORMAL LOW (ref 31.5–35.7)
MCV: 78 fL — AB (ref 79–97)
MONOS ABS: 0.6 10*3/uL (ref 0.1–0.9)
Monocytes: 5 %
Neutrophils Absolute: 7.5 10*3/uL — ABNORMAL HIGH (ref 1.4–7.0)
Neutrophils: 72 %
PLATELETS: 249 10*3/uL (ref 150–379)
RBC: 5.29 x10E6/uL — AB (ref 3.77–5.28)
RDW: 14.6 % (ref 12.3–15.4)
WBC: 10.5 10*3/uL (ref 3.4–10.8)

## 2017-10-18 LAB — BMP8+ANION GAP
ANION GAP: 15 mmol/L (ref 10.0–18.0)
BUN/Creatinine Ratio: 11 (ref 9–23)
BUN: 6 mg/dL (ref 6–24)
CALCIUM: 8.9 mg/dL (ref 8.7–10.2)
CO2: 22 mmol/L (ref 20–29)
Chloride: 100 mmol/L (ref 96–106)
Creatinine, Ser: 0.55 mg/dL — ABNORMAL LOW (ref 0.57–1.00)
GFR, EST AFRICAN AMERICAN: 136 mL/min/{1.73_m2} (ref >59)
GFR, EST NON AFRICAN AMERICAN: 118 mL/min/{1.73_m2} (ref >59)
Glucose: 186 mg/dL — ABNORMAL HIGH (ref 65–99)
Potassium: 3.7 mmol/L (ref 3.5–5.2)
Sodium: 137 mmol/L (ref 134–144)

## 2017-10-18 LAB — LIPID PANEL
CHOLESTEROL TOTAL: 149 mg/dL (ref 100–199)
Chol/HDL Ratio: 5 ratio — ABNORMAL HIGH (ref 0.0–4.4)
HDL: 30 mg/dL — ABNORMAL LOW (ref >39)
LDL Calculated: 87 mg/dL (ref 0–99)
Triglycerides: 160 mg/dL — ABNORMAL HIGH (ref 0–149)
VLDL CHOLESTEROL CAL: 32 mg/dL (ref 5–40)

## 2017-10-18 LAB — HCG, SERUM, QUALITATIVE: hCG,Beta Subunit,Qual,Serum: NEGATIVE m[IU]/mL (ref <6)

## 2017-10-18 LAB — PROLACTIN: PROLACTIN: 17.8 ng/mL (ref 4.8–23.3)

## 2017-10-20 NOTE — Progress Notes (Signed)
Internal Medicine Clinic Attending  Case discussed with Dr. Santos-Sanchez at the time of the visit.  We reviewed the resident's history and exam and pertinent patient test results.  I agree with the assessment, diagnosis, and plan of care documented in the resident's note.    

## 2017-10-21 ENCOUNTER — Other Ambulatory Visit: Payer: Self-pay | Admitting: Internal Medicine

## 2017-10-21 DIAGNOSIS — N6452 Nipple discharge: Secondary | ICD-10-CM

## 2017-11-24 ENCOUNTER — Other Ambulatory Visit: Payer: Self-pay

## 2017-11-24 ENCOUNTER — Ambulatory Visit (INDEPENDENT_AMBULATORY_CARE_PROVIDER_SITE_OTHER): Payer: Medicare Other | Admitting: Internal Medicine

## 2017-11-24 VITALS — BP 151/81 | HR 84 | Temp 98.2°F | Ht 73.0 in | Wt 300.4 lb

## 2017-11-24 DIAGNOSIS — N946 Dysmenorrhea, unspecified: Secondary | ICD-10-CM

## 2017-11-24 DIAGNOSIS — N83291 Other ovarian cyst, right side: Secondary | ICD-10-CM | POA: Diagnosis not present

## 2017-11-24 DIAGNOSIS — E119 Type 2 diabetes mellitus without complications: Secondary | ICD-10-CM

## 2017-11-24 DIAGNOSIS — N926 Irregular menstruation, unspecified: Secondary | ICD-10-CM | POA: Diagnosis not present

## 2017-11-24 DIAGNOSIS — L0291 Cutaneous abscess, unspecified: Secondary | ICD-10-CM | POA: Insufficient documentation

## 2017-11-24 DIAGNOSIS — R1031 Right lower quadrant pain: Secondary | ICD-10-CM | POA: Diagnosis not present

## 2017-11-24 DIAGNOSIS — R87810 Cervical high risk human papillomavirus (HPV) DNA test positive: Secondary | ICD-10-CM | POA: Diagnosis not present

## 2017-11-24 DIAGNOSIS — L02212 Cutaneous abscess of back [any part, except buttock]: Secondary | ICD-10-CM | POA: Diagnosis not present

## 2017-11-24 DIAGNOSIS — I10 Essential (primary) hypertension: Secondary | ICD-10-CM

## 2017-11-24 DIAGNOSIS — R1032 Left lower quadrant pain: Secondary | ICD-10-CM | POA: Diagnosis not present

## 2017-11-24 LAB — POCT URINALYSIS DIPSTICK
Bilirubin, UA: NEGATIVE
Blood, UA: NEGATIVE
Glucose, UA: NEGATIVE
Ketones, UA: NEGATIVE
LEUKOCYTES UA: NEGATIVE
Nitrite, UA: NEGATIVE
PH UA: 6 (ref 5.0–8.0)
PROTEIN UA: NEGATIVE
Spec Grav, UA: <=1.005 — AB (ref 1.010–1.025)
UROBILINOGEN UA: 0.2 U/dL

## 2017-11-24 LAB — POCT URINE PREGNANCY: PREG TEST UR: NEGATIVE

## 2017-11-24 NOTE — Assessment & Plan Note (Signed)
Patient presents for evaluation of lower abdominal pain.  She has a history of dysmenorrhea and reports irregular periods ongoing for 14 years.  She says she does her pain return over the weekend when she was using the bathroom.  She says she has having regular bowel movements without diarrhea or constipation.  She reports good urine output without dysuria.  She denies any vaginal discharge or pruritus.  She is sexually active with the same female partner of 12 years.  She says she has never been pregnant.  She reports her LMP was in March 2019.  She was seen by gynecology 1 year ago for further evaluation of her dysmenorrhea.  Pelvic ultrasound at that time showed a 6.2 cm simple appearing right ovarian cyst with benign characteristics.  Repeat ultrasound was ordered but never completed.  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  She was recommended to have a repeat Pap smear again after 1 year.  She denies any fevers, chills, diaphoresis.  A/P: Patient with chronic dysmenorrhea with bilateral lower abdominal pain.  She has some tenderness on exam at the distal lower quadrants of the abdomen.  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  Urine studies today are negative for UTI or pregnancy.  Will refer back to gynecology to reestablish care and she will follow-up with [place] again in 1 month.
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
## 2017-11-24 NOTE — Assessment & Plan Note (Signed)
Patient reports a "pimple" on her back which she first noticed about 2 weeks ago.  She says it popped 1 week ago and was draining fluid.  She has not noticed any recent drainage from the area.  She denies any fever, chills, diaphoresis, or tenderness at the area. A/P: Patient has about a 2 x 2 centimeter skin abscess on the left side of her back with slight erythema.  There is no active discharge, fluctuance, tenderness, or increased warmth to touch.  She is not having systemic signs of illness.  Recommend continued monitoring and use of warm compresses.  She is a diabetic and at increased risk for worsening infection and is counseled to call for any signs of this.  She will follow-up again in 1 month.

## 2017-11-24 NOTE — Patient Instructions (Addendum)
It was a pleasure to meet you [name].  We are checking urine studies to assess for your abdominal pain.  Your breast study is scheduled for June 20th, 2019 @ 12:40 pm at the [facility].  I am referring you back to your gynecologist.  Please continue to monitor the pimple on your back. Call us for increase in size, worsening pain, or fevers, chills, sweats.  Please follow up with Dr. Frederico Hamman in about 1 month.

## 2017-11-25 NOTE — Progress Notes (Signed)
Internal Medicine Clinic Attending  Case discussed with Dr. Patel at the time of the visit.  We reviewed the resident's history and exam and pertinent patient test results.  I agree with the assessment, diagnosis, and plan of care documented in the resident's note.  

## 2017-11-27 ENCOUNTER — Ambulatory Visit
Admission: RE | Admit: 2017-11-27 | Discharge: 2017-11-27 | Disposition: A | Discharge status: Home / Self Care | Payer: Medicare Other | Source: Ambulatory Visit | Attending: Internal Medicine | Admitting: Internal Medicine

## 2017-11-27 ENCOUNTER — Other Ambulatory Visit: Payer: Self-pay | Admitting: Internal Medicine

## 2017-11-27 DIAGNOSIS — R599 Enlarged lymph nodes, unspecified: Secondary | ICD-10-CM

## 2017-11-27 DIAGNOSIS — N6452 Nipple discharge: Secondary | ICD-10-CM

## 2017-11-27 DIAGNOSIS — N6320 Unspecified lump in the left breast, unspecified quadrant: Secondary | ICD-10-CM

## 2017-12-03 ENCOUNTER — Ambulatory Visit
Admission: RE | Admit: 2017-12-03 | Discharge: 2017-12-03 | Disposition: A | Discharge status: Home / Self Care | Payer: Medicare Other | Source: Ambulatory Visit | Attending: Internal Medicine | Admitting: Internal Medicine

## 2017-12-03 ENCOUNTER — Other Ambulatory Visit: Payer: Self-pay | Admitting: Internal Medicine

## 2017-12-03 DIAGNOSIS — N6320 Unspecified lump in the left breast, unspecified quadrant: Secondary | ICD-10-CM

## 2017-12-03 DIAGNOSIS — R599 Enlarged lymph nodes, unspecified: Secondary | ICD-10-CM

## 2017-12-10 ENCOUNTER — Other Ambulatory Visit: Payer: Self-pay | Admitting: Internal Medicine

## 2017-12-10 DIAGNOSIS — E118 Type 2 diabetes mellitus with unspecified complications: Secondary | ICD-10-CM

## 2017-12-10 NOTE — Telephone Encounter (Signed)
Next appt scheduled  7/19  With PCP.

## 2017-12-12 NOTE — Telephone Encounter (Signed)
Patient is not taking this medication. Will wait until next appt in 7/19 to refill it if patient will continue to take it.

## 2017-12-15 ENCOUNTER — Other Ambulatory Visit: Payer: Self-pay | Admitting: Surgery

## 2017-12-15 ENCOUNTER — Encounter: Payer: Self-pay | Admitting: Obstetrics & Gynecology

## 2017-12-15 ENCOUNTER — Ambulatory Visit: Payer: Self-pay | Admitting: Surgery

## 2017-12-15 ENCOUNTER — Other Ambulatory Visit (HOSPITAL_COMMUNITY)
Admission: RE | Admit: 2017-12-15 | Discharge: 2017-12-15 | Disposition: A | Discharge status: Home / Self Care | Payer: Medicare Other | Source: Ambulatory Visit | Attending: Obstetrics & Gynecology | Admitting: Obstetrics & Gynecology

## 2017-12-15 ENCOUNTER — Ambulatory Visit: Payer: Medicare Other | Admitting: Obstetrics & Gynecology

## 2017-12-15 VITALS — BP 121/64 | HR 87 | Wt 294.2 lb

## 2017-12-15 DIAGNOSIS — Z01419 Encounter for gynecological examination (general) (routine) without abnormal findings: Secondary | ICD-10-CM | POA: Diagnosis not present

## 2017-12-15 DIAGNOSIS — D242 Benign neoplasm of left breast: Secondary | ICD-10-CM

## 2017-12-15 MED ORDER — NORGESTREL-ETHINYL ESTRADIOL 0.3-30 MG-MCG PO TABS: 1.0000 | ORAL_TABLET | Freq: Every day | ORAL | 11 refills | Status: DC

## 2017-12-15 NOTE — H&P (View-Only) (Signed)
Samantha Petersen Documented: 12/15/2017 10:09 AM Location: [facility] Patient #: [id number] DOB: 1975/06/18 Married / Language: Undefined / Race: Undefined Female  History of Present Illness Samantha Moores A. Carlen Fils MD; 12/15/2017 11:48 AM) Patient words: Patient sent at the request of Dr. Dorise Bullion for left breast nipple discharge. This has been going on for at least a year. The discharge is bloody in nature. Mammogram and ultrasound were done in core biopsy performed the left breast which showed papilloma. She also had some atypical left axillary lymph nodes in this biopsy was otherwise benign. She also complains of painful lump on her left mid back. It is been present for many years. It causes mild-to-moderate discomfort especially she lays on it. She has a history of this being red and draining and this is gotten better on its own. She has no fever or chills. There is no history of breast cancer. She is clinically blind and speaks with the assistance of a translator who has accompanied her today.                         42 year old female with spontaneous LEFT nipple discharge and for annual bilateral mammograms.  EXAM: DIGITAL DIAGNOSTIC LEFT MAMMOGRAM WITH CAD AND TOMO  ULTRASOUND LEFT BREAST  COMPARISON: 02/06/2017 mammogram and ultrasound.  ACR Breast Density Category c: The breast tissue is heterogeneously dense, which may obscure small masses.  FINDINGS: 2D/3D full field and spot compression views of the LEFT breast demonstrate no definite suspicious mass, distortion or worrisome calcifications.  Mammographic images were processed with CAD.  On physical exam, I was able to elicit clear LEFT nipple discharge from a single duct.  Targeted ultrasound is performed, showing a 1 x 0.7 x 0.8 cm intraductal mass at the 1 o'clock position of the RETROAREOLAR LEFT breast.  A prominent LEFT axillary lymph node with cortical thickness of 5 mm is  noted.  IMPRESSION: 1. 1 cm intraductal mass in the UPPER-OUTER RETROAREOLAR LEFT breast, likely the cause of this patient's nipple discharge, question papilloma versus malignancy. Tissue sampling is recommended. 2. LEFT axillary lymph node with cortical thickening which may not be related to the intraductal mass but tissue sampling is recommended.  RECOMMENDATION: Ultrasound-guided biopsies of intraductal LEFT breast mass and prominent LEFT axillary lymph node, which will be scheduled.  I have discussed the findings and recommendations with the patient. Results were also provided in writing at the conclusion of the visit. If applicable, a reminder letter will be sent to the patient regarding the next appointment.  BI-RADS CATEGORY 4: Suspicious.   Electronically Signed By: Margarette Canada M.D. On: 11/27/2017 14:40             Diagnosis 1. Breast, left, needle core biopsy, 1 o'clock - INTRADUCTAL PAPILLOMA. - NO MALIGNANCY IDENTIFIED. 2. Lymph node, needle/core biopsy, left axilla - REACTIVE LYMPH NODE. - NO MALIGNANCY IDENTIFIED. Microscopic Comment 1. The case was called to [facility] on 12/04/2017. Vicente Males MD Pathologist, Electronic Signature (Case signed 12/04/2017) Specimen.  The patient is a 42 year old female.   Allergies Sabino Gasser; 12/15/2017 10:10 AM) No Known Drug Allergies [12/15/2017]: Allergies Reconciled  Medication History Sabino Gasser; 12/15/2017 10:11 AM) Atorvastatin Calcium (80MG  Tablet, Oral) Active. Glimepiride (2MG  Tablet, Oral) Active. Januvia (100MG  Tablet, Oral) Active. Lisinopril-Hydrochlorothiazide (10-12.5MG  Tablet, Oral) Active. MetFORMIN HCl (1000MG  Tablet, Oral) Active. Prodigy No Coding Blood Gluc (In Vitro) Active. Medications Reconciled    Vitals Sabino Gasser; 12/15/2017  10:12 AM) 12/15/2017 10:11 AM Weight: 293.25 lb Height: 73in Body Surface Area: 2.53 m Body Mass  Index: 38.69 kg/m  Temp.: 98.35F(Oral)  Pulse: 98 (Regular)  BP: 134/84 (Sitting, Left Arm, Standard)      Physical Exam (Kord Monette A. Kairi Harshbarger MD; 12/15/2017 11:50 AM)  General Note: blind  Integumentary Note: 3 cm mole right breast well demarcated border no variation of pigment no ulceration  5 cm left upper back sebaceous cyst tender not red or fluctuant  Head and Neck Head-normocephalic, atraumatic with no lesions or palpable masses.  Eye Note: blind  Chest and Lung Exam Chest and lung exam reveals -quiet, even and easy respiratory effort with no use of accessory muscles and on auscultation, normal breath sounds, no adventitious sounds and normal vocal resonance. Inspection Chest Wall - Normal. Back - normal.  Breast Note: right breast normal left breast with scant bloody nipple discharge and swelling no redness left axilla sore  Cardiovascular Cardiovascular examination reveals -on palpation PMI is normal in location and amplitude, no palpable S3 or S4. Normal cardiac borders., normal heart sounds, regular rate and rhythm with no murmurs, carotid auscultation reveals no bruits and normal pedal pulses bilaterally.  Musculoskeletal Normal Exam - Left-Upper Extremity Strength Normal and Lower Extremity Strength Normal. Normal Exam - Right-Upper Extremity Strength Normal, Lower Extremity Weakness.    Assessment & Plan (Lanyah Spengler A. Asuka Dusseau MD; 12/15/2017 11:51 AM)  PAPILLOMA OF BREAST (D24.9) Impression: Recommend left breast lumpectomy given small risk of malignancy. Risk of lumpectomy include bleeding, infection, seroma, more surgery, use of seed/wire, wound care, cosmetic deformity and the need for other treatments, death , blood clots, death. Pt agrees to proceed.   PAPILLOMA OF LEFT BREAST (D24.2)   SEBACEOUS CYST (L72.3) Impression: Recommend excision of sebaceous cyst left upper back to the pain. Risks, benefits and other options discussed with  the patient with the help of translator. She agrees to proceed.  Current Plans Pt Education - [facility] Education - Written Instructions given: discussed with patient and provided information. Pt Education - [facility] STOP SMOKING! Pt Education - [facility] Breast Biopsy HCI: discussed with patient and provided information. The pathophysiology of skin & subcutaneous masses was discussed. Natural history risks without surgery were discussed. I recommended surgery to remove the mass. I explained the technique of removal with use of local anesthesia & possible need for more aggressive sedation/anesthesia for patient comfort.  Risks such as bleeding, infection, wound breakdown, heart attack, death, and other risks were discussed. I noted a good likelihood this will help address the problem. Possibility that this will not correct all symptoms was explained. Possibility of regrowth/recurrence of the mass was discussed. We will work to minimize complications. Questions were answered. The patient expresses understanding & wishes to proceed with surgery.
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
## 2017-12-15 NOTE — H&P (Signed)
Samantha Petersen Documented: 12/15/2017 10:09 AM Location: [facility] Patient #: [id number] DOB: 09-06-1975 Married / Language: Undefined / Race: Undefined Female  History of Present Illness Marcello Moores A. Trell Secrist MD; 12/15/2017 11:48 AM) Patient words: Patient sent at the request of Dr. Dorise Bullion for left breast nipple discharge. This has been going on for at least a year. The discharge is bloody in nature. Mammogram and ultrasound were done in core biopsy performed the left breast which showed papilloma. She also had some atypical left axillary lymph nodes in this biopsy was otherwise benign. She also complains of painful lump on her left mid back. It is been present for many years. It causes mild-to-moderate discomfort especially she lays on it. She has a history of this being red and draining and this is gotten better on its own. She has no fever or chills. There is no history of breast cancer. She is clinically blind and speaks with the assistance of a translator who has accompanied her today.                         42 year old female with spontaneous LEFT nipple discharge and for annual bilateral mammograms.  EXAM: DIGITAL DIAGNOSTIC LEFT MAMMOGRAM WITH CAD AND TOMO  ULTRASOUND LEFT BREAST  COMPARISON: 02/06/2017 mammogram and ultrasound.  ACR Breast Density Category c: The breast tissue is heterogeneously dense, which may obscure small masses.  FINDINGS: 2D/3D full field and spot compression views of the LEFT breast demonstrate no definite suspicious mass, distortion or worrisome calcifications.  Mammographic images were processed with CAD.  On physical exam, I was able to elicit clear LEFT nipple discharge from a single duct.  Targeted ultrasound is performed, showing a 1 x 0.7 x 0.8 cm intraductal mass at the 1 o'clock position of the RETROAREOLAR LEFT breast.  A prominent LEFT axillary lymph node with cortical thickness of 5 mm is  noted.  IMPRESSION: 1. 1 cm intraductal mass in the UPPER-OUTER RETROAREOLAR LEFT breast, likely the cause of this patient's nipple discharge, question papilloma versus malignancy. Tissue sampling is recommended. 2. LEFT axillary lymph node with cortical thickening which may not be related to the intraductal mass but tissue sampling is recommended.  RECOMMENDATION: Ultrasound-guided biopsies of intraductal LEFT breast mass and prominent LEFT axillary lymph node, which will be scheduled.  I have discussed the findings and recommendations with the patient. Results were also provided in writing at the conclusion of the visit. If applicable, a reminder letter will be sent to the patient regarding the next appointment.  BI-RADS CATEGORY 4: Suspicious.   Electronically Signed By: Margarette Canada M.D. On: 11/27/2017 14:40             Diagnosis 1. Breast, left, needle core biopsy, 1 o'clock - INTRADUCTAL PAPILLOMA. - NO MALIGNANCY IDENTIFIED. 2. Lymph node, needle/core biopsy, left axilla - REACTIVE LYMPH NODE. - NO MALIGNANCY IDENTIFIED. Microscopic Comment 1. The case was called to [facility] on 12/04/2017. Vicente Males MD Pathologist, Electronic Signature (Case signed 12/04/2017) Specimen.  The patient is a 42 year old female.   Allergies Sabino Gasser; 12/15/2017 10:10 AM) No Known Drug Allergies [12/15/2017]: Allergies Reconciled  Medication History Sabino Gasser; 12/15/2017 10:11 AM) Atorvastatin Calcium (80MG  Tablet, Oral) Active. Glimepiride (2MG  Tablet, Oral) Active. Januvia (100MG  Tablet, Oral) Active. Lisinopril-Hydrochlorothiazide (10-12.5MG  Tablet, Oral) Active. MetFORMIN HCl (1000MG  Tablet, Oral) Active. Prodigy No Coding Blood Gluc (In Vitro) Active. Medications Reconciled    Vitals Sabino Gasser; 12/15/2017  10:12 AM) 12/15/2017 10:11 AM Weight: 293.25 lb Height: 73in Body Surface Area: 2.53 m Body Mass  Index: 38.69 kg/m  Temp.: 98.37F(Oral)  Pulse: 98 (Regular)  BP: 134/84 (Sitting, Left Arm, Standard)      Physical Exam (Edward Trevino A. Nolon Yellin MD; 12/15/2017 11:50 AM)  General Note: blind  Integumentary Note: 3 cm mole right breast well demarcated border no variation of pigment no ulceration  5 cm left upper back sebaceous cyst tender not red or fluctuant  Head and Neck Head-normocephalic, atraumatic with no lesions or palpable masses.  Eye Note: blind  Chest and Lung Exam Chest and lung exam reveals -quiet, even and easy respiratory effort with no use of accessory muscles and on auscultation, normal breath sounds, no adventitious sounds and normal vocal resonance. Inspection Chest Wall - Normal. Back - normal.  Breast Note: right breast normal left breast with scant bloody nipple discharge and swelling no redness left axilla sore  Cardiovascular Cardiovascular examination reveals -on palpation PMI is normal in location and amplitude, no palpable S3 or S4. Normal cardiac borders., normal heart sounds, regular rate and rhythm with no murmurs, carotid auscultation reveals no bruits and normal pedal pulses bilaterally.  Musculoskeletal Normal Exam - Left-Upper Extremity Strength Normal and Lower Extremity Strength Normal. Normal Exam - Right-Upper Extremity Strength Normal, Lower Extremity Weakness.    Assessment & Plan (Jahkari Maclin A. Geraldy Akridge MD; 12/15/2017 11:51 AM)  PAPILLOMA OF BREAST (D24.9) Impression: Recommend left breast lumpectomy given small risk of malignancy. Risk of lumpectomy include bleeding, infection, seroma, more surgery, use of seed/wire, wound care, cosmetic deformity and the need for other treatments, death , blood clots, death. Pt agrees to proceed.   PAPILLOMA OF LEFT BREAST (D24.2)   SEBACEOUS CYST (L72.3) Impression: Recommend excision of sebaceous cyst left upper back to the pain. Risks, benefits and other options discussed with  the patient with the help of translator. She agrees to proceed.  Current Plans Pt Education - [facility] Education - Written Instructions given: discussed with patient and provided information. Pt Education - [facility] STOP SMOKING! Pt Education - [facility] Breast Biopsy HCI: discussed with patient and provided information. The pathophysiology of skin & subcutaneous masses was discussed. Natural history risks without surgery were discussed. I recommended surgery to remove the mass. I explained the technique of removal with use of local anesthesia & possible need for more aggressive sedation/anesthesia for patient comfort.  Risks such as bleeding, infection, wound breakdown, heart attack, death, and other risks were discussed. I noted a good likelihood this will help address the problem. Possibility that this will not correct all symptoms was explained. Possibility of regrowth/recurrence of the mass was discussed. We will work to minimize complications. Questions were answered. The patient expresses understanding & wishes to proceed with surgery.

## 2017-12-17 ENCOUNTER — Other Ambulatory Visit: Payer: Self-pay

## 2017-12-17 ENCOUNTER — Encounter (HOSPITAL_BASED_OUTPATIENT_CLINIC_OR_DEPARTMENT_OTHER): Payer: Self-pay | Admitting: *Deleted

## 2017-12-17 LAB — CYTOLOGY - PAP
Diagnosis: NEGATIVE
HPV (WINDOPATH): NOT DETECTED

## 2017-12-22 ENCOUNTER — Other Ambulatory Visit: Payer: Self-pay

## 2017-12-22 ENCOUNTER — Encounter (HOSPITAL_BASED_OUTPATIENT_CLINIC_OR_DEPARTMENT_OTHER)
Admission: RE | Admit: 2017-12-22 | Discharge: 2017-12-22 | Disposition: A | Discharge status: Home / Self Care | Payer: Medicare Other | Source: Ambulatory Visit | Attending: Surgery | Admitting: Surgery

## 2017-12-22 DIAGNOSIS — Z01810 Encounter for preprocedural cardiovascular examination: Secondary | ICD-10-CM

## 2017-12-22 DIAGNOSIS — L720 Epidermal cyst: Secondary | ICD-10-CM | POA: Diagnosis not present

## 2017-12-22 DIAGNOSIS — Z79899 Other long term (current) drug therapy: Secondary | ICD-10-CM | POA: Diagnosis not present

## 2017-12-22 DIAGNOSIS — E119 Type 2 diabetes mellitus without complications: Secondary | ICD-10-CM | POA: Diagnosis not present

## 2017-12-22 DIAGNOSIS — F17200 Nicotine dependence, unspecified, uncomplicated: Secondary | ICD-10-CM | POA: Diagnosis not present

## 2017-12-22 DIAGNOSIS — I10 Essential (primary) hypertension: Secondary | ICD-10-CM

## 2017-12-22 DIAGNOSIS — H547 Unspecified visual loss: Secondary | ICD-10-CM | POA: Diagnosis not present

## 2017-12-22 DIAGNOSIS — Z7984 Long term (current) use of oral hypoglycemic drugs: Secondary | ICD-10-CM | POA: Diagnosis not present

## 2017-12-22 DIAGNOSIS — D242 Benign neoplasm of left breast: Secondary | ICD-10-CM | POA: Diagnosis present

## 2017-12-22 LAB — CBC WITH DIFFERENTIAL/PLATELET
ABS IMMATURE GRANULOCYTES: 0.1 10*3/uL (ref 0.0–0.1)
Basophils Absolute: 0.1 10*3/uL (ref 0.0–0.1)
Basophils Relative: 1 %
EOS ABS: 0.2 10*3/uL (ref 0.0–0.7)
EOS PCT: 2 %
HCT: 37.8 % (ref 36.0–46.0)
Hemoglobin: 12.3 g/dL (ref 12.0–15.0)
Immature Granulocytes: 1 %
Lymphocytes Relative: 29 %
Lymphs Abs: 3.4 10*3/uL (ref 0.7–4.0)
MCH: 25.1 pg — AB (ref 26.0–34.0)
MCHC: 32.5 g/dL (ref 30.0–36.0)
MCV: 77.1 fL — AB (ref 78.0–100.0)
MONO ABS: 0.7 10*3/uL (ref 0.1–1.0)
MONOS PCT: 6 %
Neutro Abs: 7.3 10*3/uL (ref 1.7–7.7)
Neutrophils Relative %: 61 %
PLATELETS: 300 10*3/uL (ref 150–400)
RBC: 4.9 MIL/uL (ref 3.87–5.11)
RDW: 14 % (ref 11.5–15.5)
WBC: 11.7 10*3/uL — ABNORMAL HIGH (ref 4.0–10.5)

## 2017-12-22 LAB — COMPREHENSIVE METABOLIC PANEL
ALT: 53 U/L — AB (ref 0–44)
AST: 37 U/L (ref 15–41)
Albumin: 3.2 g/dL — ABNORMAL LOW (ref 3.5–5.0)
Alkaline Phosphatase: 94 U/L (ref 38–126)
Anion gap: 11 (ref 5–15)
BUN: 6 mg/dL (ref 6–20)
CO2: 22 mmol/L (ref 22–32)
Calcium: 8.8 mg/dL — ABNORMAL LOW (ref 8.9–10.3)
Chloride: 100 mmol/L (ref 98–111)
Creatinine, Ser: 0.61 mg/dL (ref 0.44–1.00)
Glucose, Bld: 293 mg/dL — ABNORMAL HIGH (ref 70–99)
Potassium: 3.4 mmol/L — ABNORMAL LOW (ref 3.5–5.1)
Sodium: 133 mmol/L — ABNORMAL LOW (ref 135–145)
Total Bilirubin: 0.4 mg/dL (ref 0.3–1.2)
Total Protein: 6.6 g/dL (ref 6.5–8.1)

## 2017-12-22 NOTE — Progress Notes (Signed)
Pt given CHG soap and ensure, instructions given and pt acknowledged understanding.

## 2017-12-22 NOTE — Progress Notes (Signed)
Patient's serum glucose level is 293. Dr. Royce Macadamia aware of this and no further orders received. Pt did confirm that her blood sugar goes up to 300 at times. Will check CBG DOS as per protocol.

## 2017-12-23 ENCOUNTER — Ambulatory Visit
Admission: RE | Admit: 2017-12-23 | Discharge: 2017-12-23 | Disposition: A | Discharge status: Home / Self Care | Payer: Medicare Other | Source: Ambulatory Visit | Attending: Surgery | Admitting: Surgery

## 2017-12-23 DIAGNOSIS — D242 Benign neoplasm of left breast: Secondary | ICD-10-CM

## 2017-12-24 ENCOUNTER — Ambulatory Visit (HOSPITAL_BASED_OUTPATIENT_CLINIC_OR_DEPARTMENT_OTHER): Payer: Medicare Other | Admitting: Anesthesiology

## 2017-12-24 ENCOUNTER — Encounter (HOSPITAL_BASED_OUTPATIENT_CLINIC_OR_DEPARTMENT_OTHER)
Admission: RE | Disposition: A | Discharge status: Home / Self Care | Payer: Self-pay | Source: Ambulatory Visit | Attending: Surgery

## 2017-12-24 ENCOUNTER — Ambulatory Visit
Admission: RE | Admit: 2017-12-24 | Discharge: 2017-12-24 | Disposition: A | Discharge status: Home / Self Care | Payer: Medicare Other | Source: Ambulatory Visit | Attending: Surgery | Admitting: Surgery

## 2017-12-24 ENCOUNTER — Encounter (HOSPITAL_BASED_OUTPATIENT_CLINIC_OR_DEPARTMENT_OTHER): Payer: Self-pay | Admitting: Anesthesiology

## 2017-12-24 ENCOUNTER — Ambulatory Visit (HOSPITAL_BASED_OUTPATIENT_CLINIC_OR_DEPARTMENT_OTHER)
Admission: RE | Admit: 2017-12-24 | Discharge: 2017-12-25 | Disposition: A | Discharge status: Home / Self Care | Payer: Medicare Other | Source: Ambulatory Visit | Attending: Surgery | Admitting: Surgery

## 2017-12-24 DIAGNOSIS — L720 Epidermal cyst: Secondary | ICD-10-CM | POA: Diagnosis not present

## 2017-12-24 DIAGNOSIS — F17200 Nicotine dependence, unspecified, uncomplicated: Secondary | ICD-10-CM | POA: Insufficient documentation

## 2017-12-24 DIAGNOSIS — I10 Essential (primary) hypertension: Secondary | ICD-10-CM | POA: Insufficient documentation

## 2017-12-24 DIAGNOSIS — Z79899 Other long term (current) drug therapy: Secondary | ICD-10-CM | POA: Insufficient documentation

## 2017-12-24 DIAGNOSIS — E119 Type 2 diabetes mellitus without complications: Secondary | ICD-10-CM | POA: Diagnosis not present

## 2017-12-24 DIAGNOSIS — H547 Unspecified visual loss: Secondary | ICD-10-CM | POA: Insufficient documentation

## 2017-12-24 DIAGNOSIS — D242 Benign neoplasm of left breast: Secondary | ICD-10-CM | POA: Insufficient documentation

## 2017-12-24 DIAGNOSIS — Z7984 Long term (current) use of oral hypoglycemic drugs: Secondary | ICD-10-CM | POA: Insufficient documentation

## 2017-12-24 HISTORY — PX: CYST EXCISION: SHX5701

## 2017-12-24 HISTORY — PX: BREAST LUMPECTOMY WITH RADIOACTIVE SEED LOCALIZATION: SHX6424

## 2017-12-24 LAB — GLUCOSE, CAPILLARY
GLUCOSE-CAPILLARY: 251 mg/dL — AB (ref 70–99)
Glucose-Capillary: 236 mg/dL — ABNORMAL HIGH (ref 70–99)
Glucose-Capillary: 236 mg/dL — ABNORMAL HIGH (ref 70–99)
Glucose-Capillary: 212 mg/dL — ABNORMAL HIGH (ref 70–99)
Glucose-Capillary: 231 mg/dL — ABNORMAL HIGH (ref 70–99)
Glucose-Capillary: 251 mg/dL — ABNORMAL HIGH (ref 70–99)

## 2017-12-24 LAB — POCT PREGNANCY, URINE: Preg Test, Ur: NEGATIVE

## 2017-12-24 SURGERY — BREAST LUMPECTOMY WITH RADIOACTIVE SEED LOCALIZATION
Anesthesia: General | Site: Breast | Laterality: Left

## 2017-12-24 MED ORDER — EPHEDRINE SULFATE 50 MG/ML IJ SOLN
INTRAMUSCULAR | Status: AC
  Filled 2017-12-24: qty 1

## 2017-12-24 MED ORDER — MIDAZOLAM HCL 2 MG/2ML IJ SOLN: 1.0000 mg | INTRAMUSCULAR | Status: DC | PRN

## 2017-12-24 MED ORDER — PROPOFOL 10 MG/ML IV BOLUS
INTRAVENOUS | Status: AC
  Filled 2017-12-24: qty 20

## 2017-12-24 MED ORDER — ACETAMINOPHEN 650 MG RE SUPP: 650.0000 mg | RECTAL | Status: DC | PRN

## 2017-12-24 MED ORDER — SODIUM CHLORIDE 0.9% FLUSH: 3.0000 mL | Freq: Two times a day (BID) | INTRAVENOUS | Status: DC

## 2017-12-24 MED ORDER — CHLORHEXIDINE GLUCONATE CLOTH 2 % EX PADS: 6.0000 | MEDICATED_PAD | Freq: Once | CUTANEOUS | Status: DC

## 2017-12-24 MED ORDER — ACETAMINOPHEN 500 MG PO TABS
ORAL_TABLET | ORAL | Status: AC
Start: 2017-12-24 — End: ?
  Filled 2017-12-24: qty 2

## 2017-12-24 MED ORDER — SODIUM CHLORIDE 0.9 % IV SOLN: 250.0000 mL | INTRAVENOUS | Status: DC | PRN

## 2017-12-24 MED ORDER — INSULIN ASPART 100 UNIT/ML ~~LOC~~ SOLN
0.0000 [IU] | Freq: Three times a day (TID) | SUBCUTANEOUS | Status: DC
  Administered 2017-12-24: 5 [IU] via SUBCUTANEOUS
  Filled 2017-12-24: qty 1

## 2017-12-24 MED ORDER — PHENYLEPHRINE HCL 10 MG/ML IJ SOLN
INTRAMUSCULAR | Status: DC | PRN
  Administered 2017-12-24 (×3): 80 ug via INTRAVENOUS

## 2017-12-24 MED ORDER — HYDROCODONE-ACETAMINOPHEN 5-325 MG PO TABS: 1.0000 | ORAL_TABLET | Freq: Four times a day (QID) | ORAL | 0 refills | Status: DC | PRN

## 2017-12-24 MED ORDER — GABAPENTIN 300 MG PO CAPS
300.0000 mg | ORAL_CAPSULE | ORAL | Status: AC
  Administered 2017-12-24: 300 mg via ORAL

## 2017-12-24 MED ORDER — SODIUM CHLORIDE 0.9% FLUSH: 3.0000 mL | INTRAVENOUS | Status: DC | PRN

## 2017-12-24 MED ORDER — FENTANYL CITRATE (PF) 100 MCG/2ML IJ SOLN: 50.0000 ug | INTRAMUSCULAR | Status: DC | PRN

## 2017-12-24 MED ORDER — PROPOFOL 10 MG/ML IV BOLUS
INTRAVENOUS | Status: DC | PRN
  Administered 2017-12-24: 50 mg via INTRAVENOUS
  Administered 2017-12-24: 100 mg via INTRAVENOUS
  Administered 2017-12-24: 50 mg via INTRAVENOUS
  Administered 2017-12-24: 100 mg via INTRAVENOUS
  Administered 2017-12-24: 250 mg via INTRAVENOUS

## 2017-12-24 MED ORDER — ACETAMINOPHEN 500 MG PO TABS
1000.0000 mg | ORAL_TABLET | Freq: Four times a day (QID) | ORAL | Status: DC
  Administered 2017-12-24 – 2017-12-25 (×3): 1000 mg via ORAL
  Filled 2017-12-24 (×3): qty 2

## 2017-12-24 MED ORDER — CELECOXIB 200 MG PO CAPS
200.0000 mg | ORAL_CAPSULE | ORAL | Status: AC
  Administered 2017-12-24: 200 mg via ORAL

## 2017-12-24 MED ORDER — LACTATED RINGERS IV SOLN
INTRAVENOUS | Status: DC
  Administered 2017-12-24 (×2): via INTRAVENOUS

## 2017-12-24 MED ORDER — DEXTROSE 5 % IV SOLN
3.0000 g | INTRAVENOUS | Status: AC
  Administered 2017-12-24: 3 g via INTRAVENOUS

## 2017-12-24 MED ORDER — PHENYLEPHRINE 40 MCG/ML (10ML) SYRINGE FOR IV PUSH (FOR BLOOD PRESSURE SUPPORT)
PREFILLED_SYRINGE | INTRAVENOUS | Status: AC
  Filled 2017-12-24: qty 20

## 2017-12-24 MED ORDER — DOXYCYCLINE HYCLATE 50 MG PO CAPS: 100.0000 mg | ORAL_CAPSULE | Freq: Two times a day (BID) | ORAL | 0 refills | Status: DC

## 2017-12-24 MED ORDER — ACETAMINOPHEN 325 MG PO TABS
650.0000 mg | ORAL_TABLET | ORAL | Status: DC | PRN
Start: 2017-12-24 — End: 2017-12-25

## 2017-12-24 MED ORDER — BUPIVACAINE-EPINEPHRINE (PF) 0.25% -1:200000 IJ SOLN
INTRAMUSCULAR | Status: AC
  Filled 2017-12-24: qty 30

## 2017-12-24 MED ORDER — SUCCINYLCHOLINE CHLORIDE 200 MG/10ML IV SOSY
PREFILLED_SYRINGE | INTRAVENOUS | Status: AC
  Filled 2017-12-24: qty 10

## 2017-12-24 MED ORDER — FENTANYL CITRATE (PF) 100 MCG/2ML IJ SOLN
INTRAMUSCULAR | Status: AC
  Filled 2017-12-24: qty 2

## 2017-12-24 MED ORDER — ONDANSETRON HCL 4 MG/2ML IJ SOLN: 4.0000 mg | Freq: Once | INTRAMUSCULAR | Status: DC | PRN

## 2017-12-24 MED ORDER — DEXAMETHASONE SODIUM PHOSPHATE 10 MG/ML IJ SOLN
INTRAMUSCULAR | Status: AC
  Filled 2017-12-24: qty 1

## 2017-12-24 MED ORDER — CELECOXIB 200 MG PO CAPS
ORAL_CAPSULE | ORAL | Status: AC
  Filled 2017-12-24: qty 1

## 2017-12-24 MED ORDER — FENTANYL CITRATE (PF) 100 MCG/2ML IJ SOLN
25.0000 ug | INTRAMUSCULAR | Status: DC | PRN
  Administered 2017-12-24 (×2): 25 ug via INTRAVENOUS
  Administered 2017-12-24: 50 ug via INTRAVENOUS

## 2017-12-24 MED ORDER — ONDANSETRON HCL 4 MG/2ML IJ SOLN
INTRAMUSCULAR | Status: AC
  Filled 2017-12-24: qty 2

## 2017-12-24 MED ORDER — EPHEDRINE SULFATE 50 MG/ML IJ SOLN
INTRAMUSCULAR | Status: DC | PRN
  Administered 2017-12-24: 25 mg via INTRAVENOUS

## 2017-12-24 MED ORDER — IBUPROFEN 800 MG PO TABS: 800.0000 mg | ORAL_TABLET | Freq: Three times a day (TID) | ORAL | 0 refills | Status: DC | PRN

## 2017-12-24 MED ORDER — LIDOCAINE HCL (CARDIAC) PF 100 MG/5ML IV SOSY
PREFILLED_SYRINGE | INTRAVENOUS | Status: AC
  Filled 2017-12-24: qty 5

## 2017-12-24 MED ORDER — MIDAZOLAM HCL 5 MG/5ML IJ SOLN
INTRAMUSCULAR | Status: DC | PRN
  Administered 2017-12-24: 2 mg via INTRAVENOUS

## 2017-12-24 MED ORDER — SUCCINYLCHOLINE CHLORIDE 20 MG/ML IJ SOLN
INTRAMUSCULAR | Status: DC | PRN
  Administered 2017-12-24: 140 mg via INTRAVENOUS

## 2017-12-24 MED ORDER — LISINOPRIL 10 MG PO TABS
10.0000 mg | ORAL_TABLET | Freq: Every day | ORAL | Status: DC
  Administered 2017-12-24: 10 mg via ORAL
  Filled 2017-12-24: qty 1

## 2017-12-24 MED ORDER — MIDAZOLAM HCL 2 MG/2ML IJ SOLN
INTRAMUSCULAR | Status: AC
  Filled 2017-12-24: qty 2

## 2017-12-24 MED ORDER — MORPHINE SULFATE (PF) 2 MG/ML IV SOLN: 1.0000 mg | INTRAVENOUS | Status: DC | PRN

## 2017-12-24 MED ORDER — GABAPENTIN 300 MG PO CAPS
ORAL_CAPSULE | ORAL | Status: AC
  Filled 2017-12-24: qty 1

## 2017-12-24 MED ORDER — PHENYLEPHRINE 40 MCG/ML (10ML) SYRINGE FOR IV PUSH (FOR BLOOD PRESSURE SUPPORT)
PREFILLED_SYRINGE | INTRAVENOUS | Status: AC
  Filled 2017-12-24: qty 10

## 2017-12-24 MED ORDER — DOXYCYCLINE HYCLATE 100 MG PO TABS
100.0000 mg | ORAL_TABLET | Freq: Two times a day (BID) | ORAL | Status: DC
  Administered 2017-12-24 – 2017-12-25 (×2): 100 mg via ORAL
  Filled 2017-12-24 (×3): qty 1

## 2017-12-24 MED ORDER — ONDANSETRON HCL 4 MG/2ML IJ SOLN
INTRAMUSCULAR | Status: DC | PRN
  Administered 2017-12-24: 4 mg via INTRAVENOUS

## 2017-12-24 MED ORDER — OXYCODONE HCL 5 MG PO TABS: 5.0000 mg | ORAL_TABLET | ORAL | Status: DC | PRN

## 2017-12-24 MED ORDER — CEFAZOLIN SODIUM-DEXTROSE 2-4 GM/100ML-% IV SOLN
INTRAVENOUS | Status: AC
  Filled 2017-12-24: qty 200

## 2017-12-24 MED ORDER — LIDOCAINE HCL (CARDIAC) PF 100 MG/5ML IV SOSY
PREFILLED_SYRINGE | INTRAVENOUS | Status: DC | PRN
  Administered 2017-12-24: 2 mg via INTRAVENOUS

## 2017-12-24 MED ORDER — FENTANYL CITRATE (PF) 100 MCG/2ML IJ SOLN
INTRAMUSCULAR | Status: DC | PRN
  Administered 2017-12-24 (×4): 25 ug via INTRAVENOUS
  Administered 2017-12-24: 100 ug via INTRAVENOUS

## 2017-12-24 MED ORDER — BUPIVACAINE-EPINEPHRINE (PF) 0.25% -1:200000 IJ SOLN
INTRAMUSCULAR | Status: DC | PRN
  Administered 2017-12-24: 17 mL via PERINEURAL
  Administered 2017-12-24: 13 mL via PERINEURAL

## 2017-12-24 MED ORDER — INSULIN ASPART 100 UNIT/ML ~~LOC~~ SOLN
SUBCUTANEOUS | Status: AC
  Filled 2017-12-24: qty 1

## 2017-12-24 MED ORDER — ACETAMINOPHEN 500 MG PO TABS
1000.0000 mg | ORAL_TABLET | ORAL | Status: AC
  Administered 2017-12-24: 1000 mg via ORAL

## 2017-12-24 MED ORDER — SCOPOLAMINE 1 MG/3DAYS TD PT72: 1.0000 | MEDICATED_PATCH | Freq: Once | TRANSDERMAL | Status: DC | PRN

## 2017-12-24 SURGICAL SUPPLY — 65 items
APPLIER CLIP 9.375 MED OPEN (MISCELLANEOUS) ×4
BENZOIN TINCTURE PRP APPL 2/3 (GAUZE/BANDAGES/DRESSINGS) IMPLANT
BINDER BREAST LRG (GAUZE/BANDAGES/DRESSINGS) IMPLANT
BINDER BREAST MEDIUM (GAUZE/BANDAGES/DRESSINGS) IMPLANT
BINDER BREAST XLRG (GAUZE/BANDAGES/DRESSINGS) IMPLANT
BINDER BREAST XXLRG (GAUZE/BANDAGES/DRESSINGS) ×4 IMPLANT
BLADE SURG 10 STRL SS (BLADE) IMPLANT
BLADE SURG 15 STRL LF DISP TIS (BLADE) ×4 IMPLANT
BLADE SURG 15 STRL SS (BLADE) ×4
CANISTER SUC SOCK COL 7IN (MISCELLANEOUS) IMPLANT
CANISTER SUCT 1200ML W/VALVE (MISCELLANEOUS) ×4 IMPLANT
CHLORAPREP W/TINT 26ML (MISCELLANEOUS) ×8 IMPLANT
CLIP APPLIE 9.375 MED OPEN (MISCELLANEOUS) ×2 IMPLANT
CLOSURE WOUND 1/2 X4 (GAUZE/BANDAGES/DRESSINGS)
COVER BACK TABLE 60X90IN (DRAPES) ×4 IMPLANT
COVER MAYO STAND STRL (DRAPES) ×4 IMPLANT
COVER PROBE W GEL 5X96 (DRAPES) ×4 IMPLANT
DECANTER SPIKE VIAL GLASS SM (MISCELLANEOUS) ×4 IMPLANT
DERMABOND ADVANCED (GAUZE/BANDAGES/DRESSINGS) ×4
DERMABOND ADVANCED .7 DNX12 (GAUZE/BANDAGES/DRESSINGS) ×4 IMPLANT
DEVICE DUBIN W/COMP PLATE 8390 (MISCELLANEOUS) ×4 IMPLANT
DRAIN CHANNEL 19F RND (DRAIN) ×4 IMPLANT
DRAPE LAPAROSCOPIC ABDOMINAL (DRAPES) IMPLANT
DRAPE LAPAROTOMY 100X72 PEDS (DRAPES) ×8 IMPLANT
DRAPE UTILITY XL STRL (DRAPES) ×8 IMPLANT
ELECT COATED BLADE 2.86 ST (ELECTRODE) ×4 IMPLANT
ELECT REM PT RETURN 9FT ADLT (ELECTROSURGICAL) ×4
ELECTRODE REM PT RTRN 9FT ADLT (ELECTROSURGICAL) ×2 IMPLANT
EVACUATOR SILICONE 100CC (DRAIN) ×4 IMPLANT
GAUZE SPONGE 4X4 12PLY STRL LF (GAUZE/BANDAGES/DRESSINGS) ×4 IMPLANT
GLOVE BIOGEL PI IND STRL 7.0 (GLOVE) ×4 IMPLANT
GLOVE BIOGEL PI IND STRL 8 (GLOVE) ×4 IMPLANT
GLOVE BIOGEL PI INDICATOR 7.0 (GLOVE) ×4
GLOVE BIOGEL PI INDICATOR 8 (GLOVE) ×4
GLOVE ECLIPSE 6.5 STRL STRAW (GLOVE) ×12 IMPLANT
GLOVE ECLIPSE 8.0 STRL XLNG CF (GLOVE) ×8 IMPLANT
GOWN STRL REUS W/ TWL LRG LVL3 (GOWN DISPOSABLE) ×8 IMPLANT
GOWN STRL REUS W/TWL LRG LVL3 (GOWN DISPOSABLE) ×8
HEMOSTAT ARISTA ABSORB 3G PWDR (MISCELLANEOUS) IMPLANT
HEMOSTAT SNOW SURGICEL 2X4 (HEMOSTASIS) IMPLANT
KIT MARKER MARGIN INK (KITS) ×4 IMPLANT
NEEDLE HYPO 25X1 1.5 SAFETY (NEEDLE) ×4 IMPLANT
NS IRRIG 1000ML POUR BTL (IV SOLUTION) ×4 IMPLANT
PACK BASIN DAY SURGERY FS (CUSTOM PROCEDURE TRAY) ×4 IMPLANT
PENCIL BUTTON HOLSTER BLD 10FT (ELECTRODE) ×4 IMPLANT
PIN SAFETY STERILE (MISCELLANEOUS) ×4 IMPLANT
SLEEVE SCD COMPRESS KNEE MED (MISCELLANEOUS) ×4 IMPLANT
SPONGE LAP 4X18 RFD (DISPOSABLE) ×8 IMPLANT
STRIP CLOSURE SKIN 1/2X4 (GAUZE/BANDAGES/DRESSINGS) IMPLANT
SUT ETHILON 3 0 PS 1 (SUTURE) ×4 IMPLANT
SUT MNCRL AB 3-0 PS2 18 (SUTURE) ×4 IMPLANT
SUT MNCRL AB 4-0 PS2 18 (SUTURE) ×4 IMPLANT
SUT MON AB 4-0 PC3 18 (SUTURE) ×4 IMPLANT
SUT SILK 2 0 SH (SUTURE) IMPLANT
SUT VIC AB 0 CT1 27 (SUTURE) ×2
SUT VIC AB 0 CT1 27XBRD ANBCTR (SUTURE) ×2 IMPLANT
SUT VICRYL 3-0 CR8 SH (SUTURE) ×4 IMPLANT
SUT VICRYL AB 3 0 TIES (SUTURE) IMPLANT
SYR BULB 3OZ (MISCELLANEOUS) ×4 IMPLANT
SYR CONTROL 10ML LL (SYRINGE) ×4 IMPLANT
TOWEL GREEN STERILE FF (TOWEL DISPOSABLE) ×8 IMPLANT
TOWEL OR NON WOVEN STRL DISP B (DISPOSABLE) IMPLANT
TUBE CONNECTING 20'X1/4 (TUBING) ×1
TUBE CONNECTING 20X1/4 (TUBING) ×3 IMPLANT
YANKAUER SUCT BULB TIP NO VENT (SUCTIONS) ×4 IMPLANT

## 2017-12-24 NOTE — Anesthesia Preprocedure Evaluation (Signed)
Anesthesia Evaluation  Patient identified by MRN, date of birth, ID band Patient awake    Reviewed: Allergy & Precautions, NPO status , Patient's Chart, lab work & pertinent test results  Airway Mallampati: II  TM Distance: >3 FB Neck ROM: Full    Dental  (+) Dental Advisory Given   Pulmonary Current Smoker,    breath sounds clear to auscultation       Cardiovascular hypertension, Pt. on medications  Rhythm:Regular Rate:Normal     Neuro/Psych negative neurological ROS     GI/Hepatic negative GI ROS, Neg liver ROS,   Endo/Other  diabetes, Type 2, Oral Hypoglycemic Agents  Renal/GU negative Renal ROS     Musculoskeletal   Abdominal   Peds  Hematology negative hematology ROS (+)   Anesthesia Other Findings   Reproductive/Obstetrics                             Lab Results  Component Value Date   WBC 11.7 (H) 12/22/2017   HGB 12.3 12/22/2017   HCT 37.8 12/22/2017   MCV 77.1 (L) 12/22/2017   PLT 300 12/22/2017   Lab Results  Component Value Date   CREATININE 0.61 12/22/2017   BUN 6 12/22/2017   NA 133 (L) 12/22/2017   K 3.4 (L) 12/22/2017   CL 100 12/22/2017   CO2 22 12/22/2017    Anesthesia Physical Anesthesia Plan  ASA: III  Anesthesia Plan: General   Post-op Pain Management:    Induction: Intravenous  PONV Risk Score and Plan: 2 and Ondansetron, Midazolam and Treatment may vary due to age or medical condition  Airway Management Planned: LMA and Oral ETT  Additional Equipment:   Intra-op Plan:   Post-operative Plan: Extubation in OR  Informed Consent: I have reviewed the patients History and Physical, chart, labs and discussed the procedure including the risks, benefits and alternatives for the proposed anesthesia with the patient or authorized representative who has indicated his/her understanding and acceptance.   Dental advisory given  Plan Discussed with:  CRNA  Anesthesia Plan Comments:         Anesthesia Quick Evaluation

## 2017-12-24 NOTE — Anesthesia Procedure Notes (Signed)
Procedure Name: Intubation Date/Time: 12/24/2017 10:01 AM Performed by: Marrianne Mood, CRNA Pre-anesthesia Checklist: Patient identified, Emergency Drugs available, Suction available, Patient being monitored and Timeout performed Patient Re-evaluated:Patient Re-evaluated prior to induction Oxygen Delivery Method: Circle system utilized Preoxygenation: Pre-oxygenation with 100% oxygen Induction Type: IV induction Ventilation: Mask ventilation without difficulty Laryngoscope Size: Glidescope, 3 and Mac Grade View: Grade II Tube type: Oral Tube size: 7.0 mm Number of attempts: 1 Airway Equipment and Method: Stylet and Oral airway Placement Confirmation: ETT inserted through vocal cords under direct vision,  positive ETCO2 and breath sounds checked- equal and bilateral Secured at: 22 cm Tube secured with: Tape Dental Injury: Teeth and Oropharynx as per pre-operative assessment  Difficulty Due To: Difficulty was unanticipated, Difficult Airway- due to large tongue, Difficult Airway- due to limited oral opening and Difficult Airway- due to dentition

## 2017-12-24 NOTE — Interval H&P Note (Signed)
History and Physical Interval Note:  12/24/2017 9:23 AM  Samantha Petersen  has presented today for surgery, with the diagnosis of LEFT BREAST PAPILLOMA, LEFT BACK CYST  The various methods of treatment have been discussed with the patient and family. After consideration of risks, benefits and other options for treatment, the patient has consented to  Procedure(s): LEFT BREAST LUMPECTOMY WITH RADIOACTIVE SEED LOCALIZATION (Left) EXCISION OF LEFT BACK CYST (Left) as a surgical intervention .  The patient's history has been reviewed, patient examined, no change in status, stable for surgery.  I have reviewed the patient's chart and labs.  Questions were answered to the patient's satisfaction.     [place]

## 2017-12-24 NOTE — Transfer of Care (Signed)
Immediate Anesthesia Transfer of Care Note  Patient: Samantha Petersen  Procedure(s) Performed: LEFT BREAST LUMPECTOMY WITH RADIOACTIVE SEED LOCALIZATION (Left Breast) EXCISION OF LEFT BACK CYST (Left Back)  Patient Location: PACU  Anesthesia Type:General  Level of Consciousness: awake and patient cooperative  Airway & Oxygen Therapy: Patient Spontanous Breathing and Patient connected to face mask oxygen  Post-op Assessment: Report given to RN and Post -op Vital signs reviewed and stable  Post vital signs: Reviewed and stable  Last Vitals:  Vitals Value Taken Time  BP    Temp    Pulse    Resp    SpO2      Last Pain:  Vitals:   12/24/17 0920  TempSrc: Oral  PainSc: 3          Complications: No apparent anesthesia complications

## 2017-12-24 NOTE — Op Note (Signed)
Preoperative diagnosis: Left breast intraductal papilloma #1 and left upper back epidermal inclusion cyst 5 cm x 5 cm subcutaneous  Postoperative diagnosis: Same  Procedure: #1 left breast seed localized lumpectomy #2 excision of left upper back epidermal inclusion cyst  Surgeon: Erroll Luna, MD  Anesthesia: General with local  EBL: 20 cc  Specimen: #1 left breast tissue with seed and clip verified by Faxitron #2 remnants of chronically ruptured epidermal inclusion cyst left upper back to pathology  Drains: 19 round Blake drain to left upper back wound  Specimen: Cultures of fluid from cyst taken  IV fluids: Per anesthesia record  Indications for procedure: The patient presents for left breast lumpectomy due to papilloma detected on mammography and core biopsy.  Potential upgrade of lesion to cancer 20%.  Patient opted for lumpectomy for that.  She also had a chronically painful cyst on her left upper back which looks like a chronic epidermal inclusion cyst.  She is a history of infections in these and desired excision.  Risk, benefits and alternatives of surgery were discussed.The procedure has been discussed with the patient. Alternatives to surgery have been discussed with the patient.  Risks of surgery include bleeding,  Infection,  Seroma formation, death,  and the need for further surgery.   The patient understands and wishes to proceed.The procedure has been discussed with the patient.  Alternative therapies have been discussed with the patient.  Operative risks include bleeding,  Infection,  Organ injury,  Nerve injury,  Blood vessel injury,  DVT,  Pulmonary embolism,  Death,  And possible reoperation.  Medical management risks include worsening of present situation.  The success of the procedure is 50 -90 % at treating patients symptoms.  The patient understands and agrees to proceed.      Description of procedure: The patient was met in the holding area.  Questions were  answered.  The neoprobe was used to verify location of seed and left central breast.  I marked the cyst on her left upper back which today had some signs of inflammation.  She states she has had some drainage from this on and off over the last few days.  She denies any fever or chills.  I told her we may have to place a drain in this postoperatively to facilitate drainage and check for infection with cultures.  She understood the above and the impact this has.  Her see was placed this morning and unfortunately this needs to be removed therefore we cannot postpone her surgery second [place] to the cyst but I feel both need to be addressed and recommended antibiotics at discharge.  She voiced understanding of the plan and wished to proceed understanding the potential risks.  She was taken back to the operating room.  She was placed supine on the operating table.  The left breast was prepped and draped in sterile fashion after induction of general anesthesia.  Timeout was done to verify proper patient procedure and films available in the operating room for review.  Curvilinear incision was made along the lateral border of the nipple areolar complex.  She had multiple ducts that were dilated.  Neoprobe found the seed and that tissue was excised just below the nipple areolar complex.  Faxitron image revealed both seed and clip to be in the specimen.  The cavity was cauterized until hemostatic.  It was irrigated.  Local anesthetic was infiltrated of 0.25% Sensorcaine with epinephrine under the skin.  He was then closed with 3-0 Vicryl  and 4-0 Monocryl.  Dermabond applied.  The patient was repositioned.  Left upper back was prepped and draped in sterile fashion a second timeout was done.  The cyst had some drainage during the prep.  This was thick and murky.  There is mild erythema.  After prep and drape local anesthetic was infiltrated around the cyst.  The cyst was opened and there were signs of infection this was  cultured.  This was a chronically ruptured epidermal inclusion cyst that look like.  I debrided the inside and removed the remnants of the cyst and the cyst measured about 5 cm in maximal diameter and was excised back to healthy tissue.  Local anesthetic was infiltrated to the base of this.  Irrigation was used and hemostasis achieved with cautery.  I placed a drain in the cavity for postoperative management.  Given her multiple comorbidities and inconsistent help at home I felt a drain with closure would simplify this for her and decompress the cavity at this point time.  Through a separate stab incision a 19 round drain was placed.  This was secured to skin with 2-0 nylon.  The wound was then closed with some 0 Vicryl and 4-0 Monocryl.  Glue was applied.  All final counts were found to be correct.  Breast binder placed.  The patient was awoke extubated taken to recovery in satisfactory condition.  All final counts were found to be correct x2.

## 2017-12-25 ENCOUNTER — Encounter (HOSPITAL_BASED_OUTPATIENT_CLINIC_OR_DEPARTMENT_OTHER): Payer: Self-pay | Admitting: Surgery

## 2017-12-25 DIAGNOSIS — D242 Benign neoplasm of left breast: Secondary | ICD-10-CM | POA: Diagnosis not present

## 2017-12-25 LAB — GLUCOSE, CAPILLARY: Glucose-Capillary: 162 mg/dL — ABNORMAL HIGH (ref 70–99)

## 2017-12-25 NOTE — Discharge Instructions (Signed)
[facility] °Office Phone Number [phone] ° °BREAST BIOPSY/ PARTIAL MASTECTOMY: POST OP INSTRUCTIONS ° °Always review your discharge instruction sheet given to you by the facility where your surgery was performed. ° °IF YOU HAVE DISABILITY OR FAMILY LEAVE FORMS, YOU MUST BRING THEM TO THE OFFICE FOR PROCESSING.  DO NOT GIVE THEM TO YOUR DOCTOR. ° °1. A prescription for pain medication may be given to you upon discharge.  Take your pain medication as prescribed, if needed.  If narcotic pain medicine is not needed, then you may take acetaminophen (Tylenol) or ibuprofen (Advil) as needed. °2. Take your usually prescribed medications unless otherwise directed °3. If you need a refill on your pain medication, please contact your pharmacy.  They will contact our office to request authorization.  Prescriptions will not be filled after 5pm or on week-ends. °4. You should eat very light the first 24 hours after surgery, such as soup, crackers, pudding, etc.  Resume your normal diet the day after surgery. °5. Most patients will experience some swelling and bruising in the breast.  Ice packs and a good support bra will help.  Swelling and bruising can take several days to resolve.  °6. It is common to experience some constipation if taking pain medication after surgery.  Increasing fluid intake and taking a stool softener will usually help or prevent this problem from occurring.  A mild laxative (Milk of Magnesia or Miralax) should be taken according to package directions if there are no bowel movements after 48 hours. °7. Unless discharge instructions indicate otherwise, you may remove your bandages 24-48 hours after surgery, and you may shower at that time.  You may have steri-strips (small skin tapes) in place directly over the incision.  These strips should be left on the skin for 7-10 days.  If your surgeon used skin glue on the incision, you may shower in 24 hours.  The glue will flake off over the  next 2-3 weeks.  Any sutures or staples will be removed at the office during your follow-up visit. °8. ACTIVITIES:  You may resume regular daily activities (gradually increasing) beginning the next day.  Wearing a good support bra or sports bra minimizes pain and swelling.  You may have sexual intercourse when it is comfortable. °a. You may drive when you no longer are taking prescription pain medication, you can comfortably wear a seatbelt, and you can safely maneuver your car and apply brakes. °b. RETURN TO WORK:  ______________________________________________________________________________________ °9. You should see your doctor in the office for a follow-up appointment approximately two weeks after your surgery.  Your doctor’s nurse will typically make your follow-up appointment when she calls you with your pathology report.  Expect your pathology report 2-3 business days after your surgery.  You may call to check if you do not hear from us after three days. °10. OTHER INSTRUCTIONS: _______________________________________________________________________________________________ _____________________________________________________________________________________________________________________________________ °_____________________________________________________________________________________________________________________________________ °_____________________________________________________________________________________________________________________________________ ° °WHEN TO CALL YOUR DOCTOR: °1. Fever over 101.0 °2. Nausea and/or vomiting. °3. Extreme swelling or bruising. °4. Continued bleeding from incision. °5. Increased pain, redness, or drainage from the incision. ° °The clinic staff is available to answer your questions during regular business hours.  Please don’t hesitate to call and ask to speak to one of the nurses for clinical concerns.  If you have a medical emergency, go to the nearest  emergency room or call 911.  A surgeon from [facility] is always on call at the hospital. ° °For further questions, please visit [email or web address]  ° ° °  Drenaje quirrgico, cuidado Financial planner (Black) Los drenajes quirrgicos se utilizan para eliminar el exceso de lquido que normalmente se acumula en una herida quirrgica despus de la [place]. El drenaje quirrgico ayuda a que la herida cicatrice. Entre los distintos tipos de drenajes quirrgicos, se incluyen los siguientes:  Ecologist. Estos drenajes hacen una succin para drenar el lquido de la herida [place]. El drenaje fluye a travs de un tubo hasta un recipiente que est fuera del cuerpo. Es importante que el bulbo o el recipiente se mantenga aplanado (comprimido) en todo momento, excepto cuando deba vaciarlo. El aplanamiento del bulbo (pera de goma) o del recipiente crea succin. [employer] clases ms comunes de drenajes activos son los drenajes tipo bulbo y los drenajes Hemovac.  Drenajes pasivos. Estos drenajes permiten que el lquido drene de forma natural, por la gravedad. El lquido del drenaje fluye a travs de un tubo hasta una venda (vendaje) o un recipiente que est fuera del cuerpo. No es necesario vaciar los drenajes pasivos. El tipo ms comn de drenaje pasivo es el drenaje de [place]. El drenaje se coloca durante la [place]. Inmediatamente despus de la [place], el lquido del drenaje suele ser de color rojo brillante y un poco ms espeso que el agua. Es posible que gradualmente se torne de color amarillo o rosa y se haga menos espeso. Es probable que Investment banker, operational retire el drenaje cuando ya no drene lquido o cuando la cantidad disminuya a 1o 2cucharadas (de 75ml a 55ml) en un perodo de 24horas. [place] piel de alrededor del drenaje seca y [place] con una venda en todo momento.  [place] zona del drenaje para detectar  signos de infeccin. Controle lo siguiente: ? Aumento del enrojecimiento, la hinchazn o Conservation officer, historic buildings. ? Pus o mal olor. ? Lquido turbio en el drenaje. Siga las indicaciones del mdico acerca del cuidado del drenaje y el cambio del vendaje. Cambie el vendaje al menos una vez al da. Cmbielo con ms frecuencia si es necesario para mantenerlo seco. Haga lo siguiente: 1. Tenga a mano los [employer], incluidos los siguientes:  [place].  Solucin para destruir las bacterias (solucin salina estril).  Esponja de drenaje con gasa dividida: 4 x 4pulgadas (10cm x 10cm).  Cuadrado de gasa: 4 x 4pulgadas (10cm x 10cm). Kane manos con agua y [place] antes de cambiar el vendaje. Use desinfectante para manos si no dispone de [place] y [place]. 2. Retire el vendaje viejo. Evite hacerlo con tijeras. 3. Use solucin salina estril para limpiar la piel de alrededor del drenaje. 4. Coloque el tubo a travs de la hendidura en una esponja de drenaje. Coloque la esponja de drenaje de modo de que [place] la herida. 5. Coloque el cuadrado de gasa u otra esponja de drenaje por encima de la esponja de drenaje que est sobre la herida. Asegrese de que el tubo est entre esas capas. 6. Adhiera el vendaje a la piel con [place]. 7. Si tiene un drenaje activo Hemovac o tipo bulbo, pegue el tubo de drenaje con cinta adhesiva sobre la piel de 1a 2pulgadas (de 2,5cm a 5cm) por debajo del lugar por donde el tubo entra al cuerpo. Esto evita que el tubo tire y abra los puntos (suturas). 8. Lvese las manos con agua y [place]. 9. Anote el color del lquido del drenaje y la frecuencia con la que cambia el vendaje. INDICACIONES PARA  VACIAR EL DRENAJE ACTIVO HEMOVAC O TIPO BULBO 1. Asegrese de Best boy un recipiente medidor en el que pueda vaciar el drenaje. 2. Lvese las manos con agua y [place]. Use desinfectante para manos si no dispone de [place] y [place]. 3. Apriete muy suavemente el tubo, de forma  descendente. Esto lo limpiar. [employer] modo, se eliminar cualquier lquido, cogulo o tejido del tubo.  No tire del tubo.  Es posible que deba repetir esto varias veces por da, [employer], para mantener el tubo limpio. [place] o el tapn del drenaje. No toque el interior de la tapa o la parte inferior del tapn. 2. Vierta todo el lquido del drenaje en el recipiente medidor. 3. Comprima el bulbo o el recipiente y vuelva a colocar la tapa o el tapn. Para comprimir el bulbo o el recipiente, apritelo firmemente en el medio mientras coloca la tapa o el tapn. 4. Anote la cantidad de lquido del drenaje correspondiente a cada perodo de 24horas. Si tiene menos de 2cucharadas (71ml) de lquido del drenaje durante 24horas, comunquese con su mdico. 5. Tire el lquido del drenaje por el inodoro. 6. Lvese las manos con agua y [place]. SOLICITE ATENCIN MDICA SI:  Le aumenta el enrojecimiento, la hinchazn o el dolor alrededor de la zona del drenaje.  La cantidad de lquido del drenaje aumenta en lugar de disminuir.  Tiene pus o percibe que sale mal olor de la zona del drenaje.  Tiene fiebre.  Le drena un lquido turbio.  Hay una interrupcin o una disminucin repentinas en la cantidad de drenaje.  El tubo se sale.  El drenaje activo no se mantiene comprimido despus de vaciarlo. Esta informacin no tiene Marine scientist el consejo del mdico. Asegrese de hacerle al mdico cualquier pregunta que tenga. Document Released: 05/27/2005 Document Revised: 09/18/2015 Document Reviewed: 12/14/2014 Elsevier Interactive Patient Education  2018 [facility] Instructions  Activity: Get plenty of rest for the remainder of the day. A responsible individual must stay with you for 24 hours following the procedure.  For the next 24 hours, DO NOT: -Drive a car -Paediatric nurse -Drink alcoholic beverages -Take any medication unless  instructed by your physician -Make any legal decisions or sign important papers.  Meals: Start with liquid foods such as gelatin or soup. Progress to regular foods as tolerated. Avoid greasy, spicy, heavy foods. If nausea and/or vomiting occur, drink only clear liquids until the nausea and/or vomiting subsides. Call your physician if vomiting continues.  Special Instructions/Symptoms: Your throat may feel dry or sore from the anesthesia or the breathing tube placed in your throat during surgery. If this causes discomfort, gargle with warm salt water. The discomfort should disappear within 24 hours.  If you had a scopolamine patch placed behind your ear for the management of post- operative nausea and/or vomiting:  1. The medication in the patch is effective for 72 hours, after which it should be removed.  Wrap patch in a tissue and discard in the trash. Wash hands thoroughly with soap and water. 2. You may remove the patch earlier than 72 hours if you experience unpleasant side effects which may include dry mouth, dizziness or visual disturbances. 3. Avoid touching the patch. Wash your hands with soap and water after contact with the patch.

## 2017-12-25 NOTE — Anesthesia Postprocedure Evaluation (Signed)
Anesthesia Post Note  Patient: Samantha Petersen  Procedure(s) Performed: LEFT BREAST LUMPECTOMY WITH RADIOACTIVE SEED LOCALIZATION (Left Breast) EXCISION OF LEFT BACK CYST (Left Back)     Patient location during evaluation: PACU Anesthesia Type: General Level of consciousness: awake and alert Pain management: pain level controlled Vital Signs Assessment: post-procedure vital signs reviewed and stable Respiratory status: spontaneous breathing, nonlabored ventilation, respiratory function stable and patient connected to nasal cannula oxygen Cardiovascular status: blood pressure returned to baseline and stable Postop Assessment: no apparent nausea or vomiting Anesthetic complications: no    Last Vitals:  Vitals:   12/25/17 0636 12/25/17 0950  BP: (!) 146/74 (!) 145/84  Pulse: 75 71  Resp:  16  Temp: 37.2 C (!) 36.4 C  SpO2:  98%    Last Pain:  Vitals:   12/25/17 0950  TempSrc:   PainSc: 0-No pain   Pain Goal:                 Tiajuana Amass

## 2017-12-26 ENCOUNTER — Encounter: Payer: Medicare Other | Admitting: Internal Medicine

## 2017-12-26 LAB — AEROBIC CULTURE W GRAM STAIN (SUPERFICIAL SPECIMEN)

## 2017-12-26 LAB — AEROBIC CULTURE  (SUPERFICIAL SPECIMEN)

## 2017-12-29 LAB — ANAEROBIC CULTURE

## 2017-12-31 ENCOUNTER — Other Ambulatory Visit: Payer: Self-pay | Admitting: Student in an Organized Health Care Education/Training Program

## 2018-01-16 ENCOUNTER — Other Ambulatory Visit: Payer: Self-pay | Admitting: Internal Medicine

## 2018-01-16 DIAGNOSIS — E118 Type 2 diabetes mellitus with unspecified complications: Secondary | ICD-10-CM

## 2018-01-16 NOTE — Telephone Encounter (Signed)
In 1 month would be ideal. She needs diabetes follow up.

## 2018-01-23 ENCOUNTER — Ambulatory Visit: Payer: Medicare Other

## 2018-02-06 ENCOUNTER — Ambulatory Visit: Payer: Medicare Other

## 2018-03-10 ENCOUNTER — Other Ambulatory Visit: Payer: Self-pay | Admitting: Internal Medicine

## 2018-03-10 NOTE — Telephone Encounter (Signed)
Next appt scheduled 10/18 with PCP. 

## 2018-03-27 ENCOUNTER — Other Ambulatory Visit: Payer: Self-pay

## 2018-03-27 ENCOUNTER — Ambulatory Visit: Payer: Medicare Other | Admitting: Internal Medicine

## 2018-03-27 ENCOUNTER — Encounter: Payer: Self-pay | Admitting: Internal Medicine

## 2018-03-27 VITALS — BP 122/58 | HR 88 | Temp 98.2°F | Ht 73.0 in | Wt 291.5 lb

## 2018-03-27 DIAGNOSIS — E119 Type 2 diabetes mellitus without complications: Secondary | ICD-10-CM | POA: Diagnosis not present

## 2018-03-27 DIAGNOSIS — M791 Myalgia, unspecified site: Secondary | ICD-10-CM | POA: Diagnosis not present

## 2018-03-27 DIAGNOSIS — H548 Legal blindness, as defined in USA: Secondary | ICD-10-CM

## 2018-03-27 DIAGNOSIS — Z86018 Personal history of other benign neoplasm: Secondary | ICD-10-CM

## 2018-03-27 DIAGNOSIS — R350 Frequency of micturition: Secondary | ICD-10-CM | POA: Diagnosis not present

## 2018-03-27 DIAGNOSIS — Z7984 Long term (current) use of oral hypoglycemic drugs: Secondary | ICD-10-CM

## 2018-03-27 DIAGNOSIS — Z23 Encounter for immunization: Secondary | ICD-10-CM

## 2018-03-27 DIAGNOSIS — Z9111 Patient's noncompliance with dietary regimen: Secondary | ICD-10-CM

## 2018-03-27 DIAGNOSIS — D242 Benign neoplasm of left breast: Secondary | ICD-10-CM

## 2018-03-27 LAB — POCT GLYCOSYLATED HEMOGLOBIN (HGB A1C): Hemoglobin A1C: 9.3 % — AB (ref 4.0–5.6)

## 2018-03-27 LAB — GLUCOSE, CAPILLARY: Glucose-Capillary: 197 mg/dL — ABNORMAL HIGH (ref 70–99)

## 2018-03-27 MED ORDER — IBUPROFEN 800 MG PO TABS: 800.0000 mg | ORAL_TABLET | Freq: Three times a day (TID) | ORAL | 0 refills | Status: DC | PRN

## 2018-03-27 MED ORDER — CANAGLIFLOZIN-METFORMIN HCL 50-1000 MG PO TABS: 1.0000 | ORAL_TABLET | Freq: Two times a day (BID) | ORAL | 3 refills | Status: DC

## 2018-03-27 NOTE — Patient Instructions (Addendum)
Kash,   Continue tomando Januvia y glimepiride como siempre.   Deje de tomar metformina y comienze a Holiday representative. Este pastilla tiene la metformina incluida. Se va a tomar 1 tableta dos veces al dia.   Haga una cita de seguimiento conmigo en 3 meses o antes si necesita verme antes.   Llame a la clinica si tiene alguna pregunta o preocupacion.   - Dra. Frederico Hamman

## 2018-03-30 ENCOUNTER — Encounter: Payer: Self-pay | Admitting: Internal Medicine
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
## 2018-03-30 NOTE — Assessment & Plan Note (Signed)
Presented with breast discharge 10/2017. Mammogram 11/2017 showed 1 cm intraductal mass in L upper outer quadrant. S/p lumpectomy 12/2017. 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  Denies further discharge.  Her only complaint is pain at site of lumpectomy the resolves with ibuprofen. Incision healing well without signs of infection.  - Continue ibuprofen as needed for pain

## 2018-03-30 NOTE — Assessment & Plan Note (Signed)
Mr. Oser presents for follow-up of T2DM.  She is on maximum dose of metformin, Januvia 100, and glimepiride 4 QD and reports compliance. States BGs at home have been 200-300 since she underwent lumpectomy for intraductal papilloma of left breast in July 2019. A1c 8--> 9.3. She continues to be non-adherent with low carb diet.  We will add SGLT2 inhibitor to her regimen.  No history of DKA or LE wounds.  We spoke about importance of low-carb diet as well as exercise and weight loss for better glycemic control in addition to change in medication regimen. Would benefit from Victoza but avoiding injectables as she is blind.   - STOP metformin  - START Invokamet 50-1000 mg BID - Continue Januvia and glimepiride  - Foot exam performed today  - Counseled on importance of low carb diet as well exercise and weight loss, declined nutrition consult  - Follow up in 3 months
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
## 2018-04-04 ENCOUNTER — Other Ambulatory Visit: Payer: Self-pay | Admitting: Internal Medicine

## 2018-04-28 ENCOUNTER — Other Ambulatory Visit: Payer: Self-pay | Admitting: Internal Medicine

## 2018-04-28 MED ORDER — FLUCONAZOLE 150 MG PO TABS: ORAL_TABLET | ORAL | 0 refills | Status: DC

## 2018-06-26 ENCOUNTER — Encounter: Payer: Medicare Other | Admitting: Internal Medicine

## 2018-06-26 ENCOUNTER — Encounter: Payer: Self-pay | Admitting: Internal Medicine

## 2018-06-26 ENCOUNTER — Ambulatory Visit: Payer: Medicare Other | Admitting: Internal Medicine

## 2018-06-26 VITALS — BP 137/81 | HR 89 | Temp 98.1°F | Wt 284.1 lb

## 2018-06-26 DIAGNOSIS — R740 Nonspecific elevation of levels of transaminase and lactic acid dehydrogenase [LDH]: Secondary | ICD-10-CM

## 2018-06-26 DIAGNOSIS — Z6837 Body mass index (BMI) 37.0-37.9, adult: Secondary | ICD-10-CM

## 2018-06-26 DIAGNOSIS — R634 Abnormal weight loss: Secondary | ICD-10-CM

## 2018-06-26 DIAGNOSIS — R7401 Elevation of levels of liver transaminase levels: Secondary | ICD-10-CM

## 2018-06-26 DIAGNOSIS — Z6379 Other stressful life events affecting family and household: Secondary | ICD-10-CM

## 2018-06-26 DIAGNOSIS — F419 Anxiety disorder, unspecified: Secondary | ICD-10-CM

## 2018-06-26 DIAGNOSIS — E118 Type 2 diabetes mellitus with unspecified complications: Secondary | ICD-10-CM

## 2018-06-26 DIAGNOSIS — F339 Major depressive disorder, recurrent, unspecified: Secondary | ICD-10-CM

## 2018-06-26 DIAGNOSIS — E119 Type 2 diabetes mellitus without complications: Secondary | ICD-10-CM | POA: Diagnosis not present

## 2018-06-26 DIAGNOSIS — Z114 Encounter for screening for human immunodeficiency virus [HIV]: Secondary | ICD-10-CM | POA: Diagnosis not present

## 2018-06-26 DIAGNOSIS — I10 Essential (primary) hypertension: Secondary | ICD-10-CM | POA: Diagnosis not present

## 2018-06-26 DIAGNOSIS — Z7984 Long term (current) use of oral hypoglycemic drugs: Secondary | ICD-10-CM

## 2018-06-26 DIAGNOSIS — K439 Ventral hernia without obstruction or gangrene: Secondary | ICD-10-CM

## 2018-06-26 DIAGNOSIS — Z79899 Other long term (current) drug therapy: Secondary | ICD-10-CM

## 2018-06-26 DIAGNOSIS — G4700 Insomnia, unspecified: Secondary | ICD-10-CM

## 2018-06-26 LAB — POCT GLYCOSYLATED HEMOGLOBIN (HGB A1C): Hemoglobin A1C: 8.7 % — AB (ref 4.0–5.6)

## 2018-06-26 LAB — GLUCOSE, CAPILLARY: Glucose-Capillary: 143 mg/dL — ABNORMAL HIGH (ref 70–99)

## 2018-06-26 MED ORDER — SERTRALINE HCL 25 MG PO TABS: 25.0000 mg | ORAL_TABLET | Freq: Every day | ORAL | 2 refills | Status: DC

## 2018-06-26 MED ORDER — LORAZEPAM 0.5 MG PO TABS: 0.5000 mg | ORAL_TABLET | Freq: Once | ORAL | 0 refills | Status: AC | PRN

## 2018-06-26 MED ORDER — METFORMIN HCL ER 500 MG PO TB24: 500.0000 mg | ORAL_TABLET | Freq: Every day | ORAL | 3 refills | Status: DC

## 2018-06-26 NOTE — Assessment & Plan Note (Signed)
Patient presents for management of uncontrolled T2DM. Currently on Invokamet (swtiched from metformin->Invokamet 03/2018 due to A1c 9.3), glimepiride, and Januvia. Unfortunately, Invokamet caused vaginal and cutaneous candidiasis and she self-discontinued it in 04/2018. This infection has now resolved. She reports significant stressors at home (see A&P for anxiety) that have led to a 20-lb weight loss due to poor appetite. Her A1c today is 8.7, presumably due to weight loss.  - Start metformin 500 mg BID x 1 week --> 1000 mg BID - Continue glimepiride and Januvia  - Unable to complete foot exam as patient had to leave appt early, plan to complete at next visit  - Follow up in 3 months
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
## 2018-06-26 NOTE — Assessment & Plan Note (Signed)
Asymptomatic and stable. Remains reducible on exam. Will continue to monitor.

## 2018-06-26 NOTE — Patient Instructions (Signed)
Su diabetes esta mejor. Deje de tomarse la invokamet y comienze a Data processing manager. Tomese 1 tablet dos veces al dia por 1 semana y despues se toma 2 tabletas dos veces al dia.   Para la ansiedad le envie 2 medicamentos.   1- zoloft, tomese 1 tablet al dia  2- ativan, tomese una tableta al dia   La vamos a llamar con informacion de los servicios que le podemos ofrecer a su papa.  Haga una cita de seguimiento conmigo en 3 meses o antes si lo necesita.   Llamenos si tiene [place] duda o pregunta.

## 2018-06-26 NOTE — Assessment & Plan Note (Signed)
Noted patient has a mildly elevated on ALT at 53 on blood work from 12/2017. AST, alk phos, and total bilirubin are normal. She does not complain of abdominal pain today. Has never been tested for hepatitis or had imaging of abdomen in the past. Will repeat CMP today.

## 2018-06-26 NOTE — Assessment & Plan Note (Signed)
Well controlled on lisinopril-HCTZ. Renal function normal in 12/2017. Will continue current regimen.

## 2018-06-26 NOTE — Assessment & Plan Note (Signed)
Ms. Keagy' father was diagnosed with advanced NSCLC 1.5 years ago. He has not been doing well. His respiratory and functional status have severely declined in the past 2 months. She lives with him and states it is very difficult for her to "watch him die a little every day". She is tearful during our encounter. She is constantly worrying about and is unable to sleep at night due to checking up on him multiple times during the night. She has also lost her appetite, is more irritable than usual, and is no longer able to concentrate at work.    She has a history of depression and responded well to Zoloft. Will resume and provide short term course of benzo for anxiety.  - Start Zoloft 25 mg, side effects discussed - Ativan 0.5 mg PRN (#30 tablets, no RF), side effects discussed

## 2018-06-27 LAB — CBC WITH DIFFERENTIAL/PLATELET
BASOS: 1 %
Basophils Absolute: 0.1 10*3/uL (ref 0.0–0.2)
EOS (ABSOLUTE): 0.1 10*3/uL (ref 0.0–0.4)
Eos: 1 %
Hematocrit: 38.1 % (ref 34.0–46.6)
Hemoglobin: 12.8 g/dL (ref 11.1–15.9)
Immature Grans (Abs): 0.1 10*3/uL (ref 0.0–0.1)
Immature Granulocytes: 1 %
Lymphocytes Absolute: 3.9 10*3/uL — ABNORMAL HIGH (ref 0.7–3.1)
Lymphs: 31 %
MCH: 24.9 pg — ABNORMAL LOW (ref 26.6–33.0)
MCHC: 33.6 g/dL (ref 31.5–35.7)
MCV: 74 fL — AB (ref 79–97)
MONOS ABS: 0.7 10*3/uL (ref 0.1–0.9)
Monocytes: 6 %
NEUTROS ABS: 7.7 10*3/uL — AB (ref 1.4–7.0)
NEUTROS PCT: 60 %
PLATELETS: 314 10*3/uL (ref 150–450)
RBC: 5.14 x10E6/uL (ref 3.77–5.28)
RDW: 15 % (ref 11.7–15.4)
WBC: 12.6 10*3/uL — ABNORMAL HIGH (ref 3.4–10.8)

## 2018-06-27 LAB — HIV ANTIBODY (ROUTINE TESTING W REFLEX): HIV SCREEN 4TH GENERATION: NONREACTIVE

## 2018-06-27 LAB — CMP14 + ANION GAP
ALK PHOS: 106 IU/L (ref 39–117)
ALT: 85 IU/L — ABNORMAL HIGH (ref 0–32)
AST: 78 IU/L — ABNORMAL HIGH (ref 0–40)
Albumin/Globulin Ratio: 1.3 (ref 1.2–2.2)
Albumin: 3.8 g/dL (ref 3.5–5.5)
Anion Gap: 18 mmol/L (ref 10.0–18.0)
BUN / CREAT RATIO: 12 (ref 9–23)
BUN: 6 mg/dL (ref 6–24)
Bilirubin Total: 0.4 mg/dL (ref 0.0–1.2)
CO2: 19 mmol/L — ABNORMAL LOW (ref 20–29)
Calcium: 9 mg/dL (ref 8.7–10.2)
Chloride: 100 mmol/L (ref 96–106)
Creatinine, Ser: 0.51 mg/dL — ABNORMAL LOW (ref 0.57–1.00)
GFR calc Af Amer: 138 mL/min/{1.73_m2} (ref >59)
GFR calc non Af Amer: 120 mL/min/{1.73_m2} (ref >59)
GLOBULIN, TOTAL: 3 g/dL (ref 1.5–4.5)
Glucose: 136 mg/dL — ABNORMAL HIGH (ref 65–99)
Potassium: 3.9 mmol/L (ref 3.5–5.2)
SODIUM: 137 mmol/L (ref 134–144)
Total Protein: 6.8 g/dL (ref 6.0–8.5)

## 2018-07-04 NOTE — Progress Notes (Signed)
Internal Medicine Clinic Attending  Case discussed with Dr. Santos-Sanchez at the time of the visit.  We reviewed the resident's history and exam and pertinent patient test results.  I agree with the assessment, diagnosis, and plan of care documented in the resident's note.    

## 2018-07-16 ENCOUNTER — Other Ambulatory Visit: Payer: Self-pay | Admitting: Internal Medicine

## 2018-07-16 DIAGNOSIS — E119 Type 2 diabetes mellitus without complications: Secondary | ICD-10-CM

## 2018-07-16 MED ORDER — METFORMIN HCL ER (MOD) 1000 MG PO TB24: 500.0000 mg | ORAL_TABLET | Freq: Every day | ORAL | 3 refills | Status: DC

## 2018-07-17 ENCOUNTER — Other Ambulatory Visit: Payer: Self-pay | Admitting: Internal Medicine

## 2018-07-17 DIAGNOSIS — E119 Type 2 diabetes mellitus without complications: Secondary | ICD-10-CM

## 2018-07-19 ENCOUNTER — Other Ambulatory Visit: Payer: Self-pay | Admitting: Internal Medicine

## 2018-07-19 DIAGNOSIS — F419 Anxiety disorder, unspecified: Secondary | ICD-10-CM

## 2018-07-20 ENCOUNTER — Telehealth: Payer: Self-pay | Admitting: *Deleted

## 2018-07-20 NOTE — Telephone Encounter (Signed)
Call from [facility] - stated Metformin ER 1000 mg is not covered by her insurance; please change to 500 mg BID which is covered. Send new rx. Thanks

## 2018-07-20 NOTE — Telephone Encounter (Signed)
New Rx sent.

## 2018-08-21 ENCOUNTER — Ambulatory Visit: Payer: Medicare Other | Admitting: Internal Medicine

## 2018-08-21 ENCOUNTER — Encounter: Payer: Self-pay | Admitting: Internal Medicine

## 2018-08-21 ENCOUNTER — Other Ambulatory Visit: Payer: Self-pay

## 2018-08-21 VITALS — BP 135/80 | HR 91 | Temp 97.4°F | Ht 73.0 in | Wt 284.7 lb

## 2018-08-21 DIAGNOSIS — F419 Anxiety disorder, unspecified: Secondary | ICD-10-CM | POA: Diagnosis not present

## 2018-08-21 DIAGNOSIS — I10 Essential (primary) hypertension: Secondary | ICD-10-CM

## 2018-08-21 DIAGNOSIS — Z79899 Other long term (current) drug therapy: Secondary | ICD-10-CM

## 2018-08-21 DIAGNOSIS — R7401 Elevation of levels of liver transaminase levels: Secondary | ICD-10-CM

## 2018-08-21 DIAGNOSIS — E119 Type 2 diabetes mellitus without complications: Secondary | ICD-10-CM

## 2018-08-21 DIAGNOSIS — Z7984 Long term (current) use of oral hypoglycemic drugs: Secondary | ICD-10-CM

## 2018-08-21 DIAGNOSIS — R740 Nonspecific elevation of levels of transaminase and lactic acid dehydrogenase [LDH]: Secondary | ICD-10-CM

## 2018-08-21 DIAGNOSIS — E118 Type 2 diabetes mellitus with unspecified complications: Secondary | ICD-10-CM

## 2018-08-21 LAB — GLUCOSE, CAPILLARY: Glucose-Capillary: 170 mg/dL — ABNORMAL HIGH (ref 70–99)

## 2018-08-21 NOTE — Assessment & Plan Note (Addendum)
Samantha Petersen has a history of uncontrolled T2DM. She was last seen in 06/2018 at which time her A1c was 8.7 from 9.3 after an unintentional 16 lb weight loss in the setting of stressors at home. She presents today for follow up. She has been doing well at home. Weight is stable. Remains on metformin 500 mg BID (N/V and diarrhea on maximum dose) as well as glimepiride and Januvia. She has not been able to check her BG at home as she is blind and her father, who used to do it for her, passed away 2 months ago.We will look into permanent CGM, though she would need an app on her phone that could tell her the number. Declined  SGLT2 inhibitor due to vaginal and cutaneous candidiasis with Invokamet. She would like to continue to work on lifestyle modifications and declined changes to her current regimen today.  - Continue current regimen and follow up in 2 months  - A1c at next visit in 2 months  - Declined foot exam, had compression stockings on

## 2018-08-21 NOTE — Patient Instructions (Signed)
Samantha Petersen,   Continua tomandote tus medicamentos como usualmente lo haces.   Para chequearte el higado, ordene pruebas de [place] y un ultrasonido. El departamento de radiologia te va a llamar para hacer una cita para hacerte el [address].   Haz una cita de seguimiento conmigo en 2 meses.   Llama si tienes cualquier duda o pregunta.   -Dra. Frederico Hamman
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
## 2018-08-21 NOTE — Assessment & Plan Note (Addendum)
Mild transaminits in most recent labs. Discussed with patient today. Denied abdominal pain, N/V, use of acetaminophen and IVDU. Suspect secondary to fatty liver. Will evaluate further with RUQ ultrasound and hepatitis studies.

## 2018-08-22 LAB — HEPATITIS C ANTIBODY: Hep C Virus Ab: <0.1 s/co ratio (ref 0.0–0.9)

## 2018-08-22 LAB — HEPATITIS B CORE ANTIBODY, TOTAL: Hep B Core Total Ab: NEGATIVE

## 2018-08-22 LAB — HEPATITIS B CORE ANTIBODY, IGM: Hep B C IgM: NEGATIVE

## 2018-08-22 LAB — HEPATITIS B SURFACE ANTIBODY,QUALITATIVE: Hep B Surface Ab, Qual: REACTIVE

## 2018-08-22 LAB — HEPATITIS B SURFACE ANTIGEN: Hepatitis B Surface Ag: NEGATIVE

## 2018-08-23 ENCOUNTER — Encounter: Payer: Self-pay | Admitting: Internal Medicine

## 2018-08-23 NOTE — Assessment & Plan Note (Signed)
Samantha Petersen had been experiencing anxiety at home while taking care of her father who had end-stage lung cancer. He passed away 2 months ago. She reports feeling sad but overall is doing well. She states she feels happy that he is no longer suffering. Has good support from co-workers and [employer]. She denies SI and HI.   I prescribed Zoloft and short-course of Ativan 2 months ago when I last saw her, but she only took the medications for a few days because she did not like how they made her feel (lightheaded and sleepy). Will stop both medications per patient request and continue to monitor for worsening symptoms.  - Will continue to monitor, follow up in 1-2 months  - STOP Zoloft and Ativan  - Declined counseling

## 2018-08-24 NOTE — Progress Notes (Signed)
Internal Medicine Clinic Attending  Case discussed with Dr. Santos-Sanchez at the time of the visit.  We reviewed the resident's history and exam and pertinent patient test results.  I agree with the assessment, diagnosis, and plan of care documented in the resident's note.    

## 2018-09-13 ENCOUNTER — Other Ambulatory Visit: Payer: Self-pay | Admitting: Internal Medicine

## 2018-09-13 DIAGNOSIS — E118 Type 2 diabetes mellitus with unspecified complications: Secondary | ICD-10-CM

## 2018-09-14 NOTE — Telephone Encounter (Signed)
Next appt scheduled  5/15 with PCP. 

## 2018-10-01 ENCOUNTER — Other Ambulatory Visit: Payer: Self-pay | Admitting: Internal Medicine

## 2018-10-23 ENCOUNTER — Other Ambulatory Visit: Payer: Self-pay

## 2018-10-23 ENCOUNTER — Telehealth: Payer: Self-pay | Admitting: Internal Medicine

## 2018-10-23 ENCOUNTER — Encounter: Payer: Medicare Other | Admitting: Internal Medicine

## 2018-10-23 NOTE — Telephone Encounter (Signed)
Pt missed Dr Frederico Hamman call this morning, please call [phone]

## 2018-11-10 ENCOUNTER — Ambulatory Visit (HOSPITAL_COMMUNITY): Payer: Medicare Other

## 2018-11-17 ENCOUNTER — Other Ambulatory Visit: Payer: Self-pay | Admitting: Internal Medicine

## 2018-12-13 ENCOUNTER — Other Ambulatory Visit: Payer: Self-pay | Admitting: Internal Medicine

## 2018-12-21 ENCOUNTER — Other Ambulatory Visit: Payer: Self-pay | Admitting: Internal Medicine

## 2018-12-21 ENCOUNTER — Telehealth: Payer: Self-pay

## 2018-12-21 NOTE — Telephone Encounter (Signed)
Needs refill on metFORMIN (GLUCOPHAGE-XR) 500 MG 24 hr tablet [id number] - [place], [place] [address]  ;pt contact [phone]

## 2018-12-21 NOTE — Telephone Encounter (Signed)
Called patient. Currently at work and unable to talk. Will call tomorrow after 5pm.

## 2018-12-21 NOTE — Telephone Encounter (Signed)
Requesting to speak with Dr. Isac Sarna.

## 2018-12-21 NOTE — Telephone Encounter (Signed)
#  180 with 3 refills sent 07/20/2018. Confirmed with pharmacy they have this Rx, however, patient picked up 90 day supply in May. Earliest she can fill it is 12/31/2018. Pharmacist states her lancets and glimepiride are ready for p/u. Detailed message left on patient's English speaking, self-identified VM. Requested return call for any questions. Hubbard Hartshorn, RN, BSN

## 2018-12-23 NOTE — Telephone Encounter (Signed)
Patient called in very upset. States she's been taking metformin XR 500 mg 2 tabs BID and only has 3 tabs left. Explained that Rx was for 2 tabs with breakfast only and insurance will not cover refill till 12/31/2018.  Pt had recent visit from [place] who did A1C and it was 9. Also, with glucose in urine. States "metformin is not good for my body and I'm scared. I have no one in this country now that my papa is gone." States she cannot receive calls during the day because she works. Requesting PCP call after 5 PM. Hubbard Hartshorn, RN, BSN

## 2018-12-24 NOTE — Telephone Encounter (Signed)
Thanks, Lauren. I've called her a few times already after 5PM and have not been able to reach her. I'm on call today on the inpatient service so I will probably busy this afternoon but I will try to call her again.

## 2019-01-04 ENCOUNTER — Other Ambulatory Visit: Payer: Self-pay

## 2019-01-04 ENCOUNTER — Ambulatory Visit: Payer: Medicare Other | Admitting: Internal Medicine

## 2019-01-04 ENCOUNTER — Encounter: Payer: Self-pay | Admitting: Internal Medicine

## 2019-01-04 VITALS — BP 114/65 | HR 94 | Temp 98.0°F | Ht 73.0 in | Wt 279.3 lb

## 2019-01-04 DIAGNOSIS — H548 Legal blindness, as defined in USA: Secondary | ICD-10-CM

## 2019-01-04 DIAGNOSIS — K439 Ventral hernia without obstruction or gangrene: Secondary | ICD-10-CM

## 2019-01-04 DIAGNOSIS — F32A Depression, unspecified: Secondary | ICD-10-CM

## 2019-01-04 DIAGNOSIS — F329 Major depressive disorder, single episode, unspecified: Secondary | ICD-10-CM

## 2019-01-04 DIAGNOSIS — E119 Type 2 diabetes mellitus without complications: Secondary | ICD-10-CM

## 2019-01-04 DIAGNOSIS — L989 Disorder of the skin and subcutaneous tissue, unspecified: Secondary | ICD-10-CM | POA: Diagnosis not present

## 2019-01-04 DIAGNOSIS — Z7984 Long term (current) use of oral hypoglycemic drugs: Secondary | ICD-10-CM

## 2019-01-04 LAB — POCT GLYCOSYLATED HEMOGLOBIN (HGB A1C): Hemoglobin A1C: 8.3 % — AB (ref 4.0–5.6)

## 2019-01-04 LAB — GLUCOSE, CAPILLARY: Glucose-Capillary: 224 mg/dL — ABNORMAL HIGH (ref 70–99)

## 2019-01-04 MED ORDER — METFORMIN HCL ER 500 MG PO TB24: 1000.0000 mg | ORAL_TABLET | Freq: Two times a day (BID) | ORAL | 10 refills | Status: DC

## 2019-01-04 NOTE — Patient Instructions (Signed)
Rhodia,   Le rellene la receta de metformina. Tomese 2 tabletas 2 veces al dia.   Le voy a hacer un referrido al dermatologo para las verrugas y uno al cirjuano parala hernia.   Haga una cita de seguimiento conmigo en 3 meses o antes si me necesita.   - Dr. Frederico Hamman

## 2019-01-05 ENCOUNTER — Encounter: Payer: Self-pay | Admitting: Internal Medicine

## 2019-01-05 DIAGNOSIS — L989 Disorder of the skin and subcutaneous tissue, unspecified: Secondary | ICD-10-CM | POA: Insufficient documentation

## 2019-01-05 NOTE — Assessment & Plan Note (Signed)
Patient reports having a depressed mood.  She states that sometimes she does not know what her purpose is, but that she has good friends that support her every day.  Her dad who was her primary caregiver due to blindness passed away 6 months ago from lung cancer and she has had a difficult time since then.  She is trying to become more independent now that he is not around to help.  She thinks she is doing a good job at this and is very proud of herself.  In addition, her longtime boyfriend left her 1 months ago.  She is also adjusting to new housemates for she is having difficulty with. She denies SI and HI and states "I am too young to die and I am sure God has something good planned for me."  She has tried Zoloft with no improvement, complaint of insomnia with it.  I also prescribed a short course of Ativan in the past that she did not take because she developed sleepiness after the first dose.  Interestingly her PHQ 9 is 4.  She declined starting new medication today and counseling. I think she symptoms of depression mixed with adjustment disorders. We will continue to monitor her symptoms closely and assist her in any way we can.

## 2019-01-05 NOTE — Assessment & Plan Note (Signed)
Patient presents complaining of 2 neck lesions that appear like warts on exam. These are not painful or itchy but she would like them taken out for aesthetic reasons.  Given 1 of the lesions is close to face/ear I will refer to dermatology for this.

## 2019-01-05 NOTE — Assessment & Plan Note (Signed)
Patient continues to complain of supraumbilical hernia that is causing distress to her aesthetically. She denies abdominal pain, issues with eating, or changes in bowel movement.  She likes to wear tight jeans and her hernia bulges up above the waist line. She wears spanx every day to help with this but these are uncomfortable, especially in the summer. Will refer to general surgery.

## 2019-01-05 NOTE — Assessment & Plan Note (Signed)
Patient presents for diabetes follow-up.  She is currently on glimepiride, Januvia, and metformin.  She is compliant. She has had issues with her pharmacy dispensing metformin as a once daily dosing and had to purchase additional tablets for twice daily dosing but ran out.  Surprisingly her A1c is a little bit better today at 8.3.  I called her pharmacy to sort this out and send a new prescription for Metformin XR 1000 mg twice daily.  We will also continue glimepiride and Januvia.  One of her major  barriers for better glycemic control is diet.  She has a high carbohydrate diet including rice, beans, and sugary drinks.  Unfortunately she does depend other people frequently for meals, she cannot cook due to blindness.  I continue to counsel her on low carbohydrate diet.  She declined furl to nutrition at this time.  Follow-up in 3 months.  Foot exam completed today.
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
## 2019-01-06 NOTE — Progress Notes (Signed)
Internal Medicine Clinic Attending  Case discussed with Dr. Santos-Sanchez soon after the resident saw the patient.  We reviewed the resident's history and exam and pertinent patient test results.  I agree with the assessment, diagnosis, and plan of care documented in the resident's note.   

## 2019-01-15 ENCOUNTER — Other Ambulatory Visit: Payer: Self-pay | Admitting: Internal Medicine

## 2019-01-15 MED ORDER — ATORVASTATIN CALCIUM 20 MG PO TABS: 20.0000 mg | ORAL_TABLET | Freq: Every day | ORAL | 3 refills | Status: DC

## 2019-01-18 ENCOUNTER — Other Ambulatory Visit: Payer: Self-pay | Admitting: Internal Medicine

## 2019-01-18 DIAGNOSIS — F419 Anxiety disorder, unspecified: Secondary | ICD-10-CM

## 2019-01-20 ENCOUNTER — Other Ambulatory Visit: Payer: Self-pay | Admitting: Internal Medicine

## 2019-01-20 DIAGNOSIS — E118 Type 2 diabetes mellitus with unspecified complications: Secondary | ICD-10-CM

## 2019-03-22 ENCOUNTER — Encounter: Payer: Self-pay | Admitting: Internal Medicine

## 2019-03-22 ENCOUNTER — Other Ambulatory Visit: Payer: Self-pay

## 2019-03-22 ENCOUNTER — Ambulatory Visit: Payer: Medicare Other | Admitting: Internal Medicine

## 2019-03-22 VITALS — BP 117/65 | HR 85 | Ht 73.0 in | Wt 281.6 lb

## 2019-03-22 DIAGNOSIS — Z7984 Long term (current) use of oral hypoglycemic drugs: Secondary | ICD-10-CM | POA: Diagnosis not present

## 2019-03-22 DIAGNOSIS — Z23 Encounter for immunization: Secondary | ICD-10-CM

## 2019-03-22 DIAGNOSIS — F329 Major depressive disorder, single episode, unspecified: Secondary | ICD-10-CM | POA: Diagnosis not present

## 2019-03-22 DIAGNOSIS — E119 Type 2 diabetes mellitus without complications: Secondary | ICD-10-CM | POA: Diagnosis not present

## 2019-03-22 DIAGNOSIS — H547 Unspecified visual loss: Secondary | ICD-10-CM

## 2019-03-22 DIAGNOSIS — F32A Depression, unspecified: Secondary | ICD-10-CM

## 2019-03-22 LAB — GLUCOSE, CAPILLARY: Glucose-Capillary: 230 mg/dL — ABNORMAL HIGH (ref 70–99)

## 2019-03-22 LAB — POCT GLYCOSYLATED HEMOGLOBIN (HGB A1C): Hemoglobin A1C: 9.1 % — AB (ref 4.0–5.6)

## 2019-03-22 MED ORDER — SEMAGLUTIDE 7 MG PO TABS: 7.0000 mg | ORAL_TABLET | Freq: Every day | ORAL | 2 refills | Status: DC

## 2019-03-22 MED ORDER — IBUPROFEN 800 MG PO TABS: 800.0000 mg | ORAL_TABLET | Freq: Two times a day (BID) | ORAL | 0 refills | Status: DC | PRN

## 2019-03-22 NOTE — Patient Instructions (Addendum)
Edwin,   Comienze a tomar semaglutide 3 mg todas las mananas 30 minutos antes de desayunar. Le dimos [employer] gratis, voy a Secondary school teacher receta a su farmacia pero va a ser de 7 mg. Continue tomando el resto de sus medicamentos como siempre. Haga una cita de seguimiento conmigo en 3 meses para chequear su diabetes.   - Dr. Frederico Hamman

## 2019-03-22 NOTE — Assessment & Plan Note (Signed)
Patient reports feeling incredibly better today and denies a depressed mood, anxiety, weight changes, issues with concentrations or guilt, and SI.  She now has a stable living situation with her roommate and has found a new partner who supports her and accompanies her to her appointments.  She also reports feeling more at peace with the recent death of her father.  We will continue to monitor for symptoms.

## 2019-03-22 NOTE — Assessment & Plan Note (Addendum)
Patient presents for diabetes follow-up.  She is on maximum dose of metformin, Januvia, and glimepiride, reports compliance.  Unfortunately, her A1c has increased from 8.3 to 9.1 (CBG 230) which I suspect is from dietary indiscretions as patient is used to eating foods that are high in carbohydrates.  We spoke about trying once weekly injectables such as Trulicity to help with glycemic control, but patient is hesitant about this due to blindness.  We talked about training a friend to help with this, but patient declined as she does not want to be dependent on others to administer medications. - Continue metformin, Januvia, and glimepiride at current doses - Start semaglutide 3 mg daily with plan to uptitrate to 7 mg on 11/12 if no side effects.  Patient instructed to take 30 minutes prior to breakfast. One-month sample provided.  Will work on prior authorization.  - Up-to-date on foot exam.  Eye exam not indicated.
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
## 2019-03-22 NOTE — Progress Notes (Signed)
Internal Medicine Clinic Attending  Case discussed with Dr. Santos-Sanchez at the time of the visit.  We reviewed the resident's history and exam and pertinent patient test results.  I agree with the assessment, diagnosis, and plan of care documented in the resident's note.  Alexander Raines, M.D., Ph.D.  

## 2019-04-04 ENCOUNTER — Other Ambulatory Visit: Payer: Self-pay | Admitting: Internal Medicine

## 2019-04-06 ENCOUNTER — Encounter: Payer: Self-pay | Admitting: Internal Medicine

## 2019-04-15 ENCOUNTER — Other Ambulatory Visit: Payer: Self-pay | Admitting: Surgery

## 2019-04-15 ENCOUNTER — Other Ambulatory Visit: Payer: Self-pay | Admitting: Physical Therapy

## 2019-04-15 DIAGNOSIS — R1900 Intra-abdominal and pelvic swelling, mass and lump, unspecified site: Secondary | ICD-10-CM

## 2019-04-26 ENCOUNTER — Ambulatory Visit
Admission: RE | Admit: 2019-04-26 | Discharge: 2019-04-26 | Disposition: A | Discharge status: Home / Self Care | Payer: Medicare Other | Source: Ambulatory Visit | Attending: Surgery | Admitting: Surgery

## 2019-04-26 ENCOUNTER — Other Ambulatory Visit: Payer: Self-pay

## 2019-04-26 DIAGNOSIS — R1900 Intra-abdominal and pelvic swelling, mass and lump, unspecified site: Secondary | ICD-10-CM

## 2019-04-26 MED ORDER — IOPAMIDOL (ISOVUE-300) INJECTION 61%
125.0000 mL | Freq: Once | INTRAVENOUS | Status: AC | PRN
  Administered 2019-04-26: 100 mL via INTRAVENOUS

## 2019-05-03 ENCOUNTER — Other Ambulatory Visit: Payer: Self-pay | Admitting: Surgery

## 2019-05-03 DIAGNOSIS — E278 Other specified disorders of adrenal gland: Secondary | ICD-10-CM

## 2019-06-01 ENCOUNTER — Ambulatory Visit
Admission: RE | Admit: 2019-06-01 | Discharge: 2019-06-01 | Disposition: A | Discharge status: Home / Self Care | Payer: Medicare Other | Source: Ambulatory Visit | Attending: Surgery | Admitting: Surgery

## 2019-06-01 ENCOUNTER — Encounter: Payer: Self-pay | Admitting: Radiology

## 2019-06-01 ENCOUNTER — Other Ambulatory Visit: Payer: Self-pay

## 2019-06-01 DIAGNOSIS — E278 Other specified disorders of adrenal gland: Secondary | ICD-10-CM

## 2019-06-01 MED ORDER — GADOBENATE DIMEGLUMINE 529 MG/ML IV SOLN
20.0000 mL | Freq: Once | INTRAVENOUS | Status: AC | PRN
  Administered 2019-06-01: 20 mL via INTRAVENOUS

## 2019-07-12 ENCOUNTER — Ambulatory Visit (INDEPENDENT_AMBULATORY_CARE_PROVIDER_SITE_OTHER): Payer: Medicare Other | Admitting: Internal Medicine

## 2019-07-12 ENCOUNTER — Encounter: Payer: Self-pay | Admitting: Internal Medicine

## 2019-07-12 ENCOUNTER — Other Ambulatory Visit: Payer: Self-pay

## 2019-07-12 VITALS — BP 121/69 | HR 85 | Temp 97.6°F | Ht 73.0 in | Wt 284.2 lb

## 2019-07-12 DIAGNOSIS — H547 Unspecified visual loss: Secondary | ICD-10-CM

## 2019-07-12 DIAGNOSIS — E278 Other specified disorders of adrenal gland: Secondary | ICD-10-CM | POA: Diagnosis not present

## 2019-07-12 DIAGNOSIS — Z79899 Other long term (current) drug therapy: Secondary | ICD-10-CM

## 2019-07-12 DIAGNOSIS — E119 Type 2 diabetes mellitus without complications: Secondary | ICD-10-CM | POA: Diagnosis not present

## 2019-07-12 DIAGNOSIS — Z7984 Long term (current) use of oral hypoglycemic drugs: Secondary | ICD-10-CM

## 2019-07-12 DIAGNOSIS — I10 Essential (primary) hypertension: Secondary | ICD-10-CM | POA: Diagnosis not present

## 2019-07-12 DIAGNOSIS — B078 Other viral warts: Secondary | ICD-10-CM | POA: Diagnosis not present

## 2019-07-12 DIAGNOSIS — B079 Viral wart, unspecified: Secondary | ICD-10-CM

## 2019-07-12 LAB — POCT GLYCOSYLATED HEMOGLOBIN (HGB A1C): Hemoglobin A1C: 8.4 % — AB (ref 4.0–5.6)

## 2019-07-12 LAB — GLUCOSE, CAPILLARY: Glucose-Capillary: 202 mg/dL — ABNORMAL HIGH (ref 70–99)

## 2019-07-12 MED ORDER — SEMAGLUTIDE 7 MG PO TABS: 7.0000 mg | ORAL_TABLET | Freq: Every day | ORAL | 5 refills | Status: DC
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
## 2019-07-12 NOTE — Patient Instructions (Addendum)
Ahmari,   Te envie un refill de semaglutide, la misma dosis que antes. Te vas a tomar una tableta a diario.   Te hice un referrido al dermatologo para que te quemen la verruga del cuello.   Haz una cita se seguimiento conmigo en 3 meses.   - Dr. Frederico Hamman

## 2019-07-12 NOTE — Assessment & Plan Note (Signed)
Well controlled on lisinopril-HCTZ. Will continue. BMP today to check lytes and renal function.

## 2019-07-13 ENCOUNTER — Encounter: Payer: Self-pay | Admitting: Internal Medicine

## 2019-07-13 DIAGNOSIS — E278 Other specified disorders of adrenal gland: Secondary | ICD-10-CM | POA: Insufficient documentation

## 2019-07-13 LAB — CMP14 + ANION GAP
ALT: 41 IU/L — ABNORMAL HIGH (ref 0–32)
AST: 47 IU/L — ABNORMAL HIGH (ref 0–40)
Albumin/Globulin Ratio: 1.3 (ref 1.2–2.2)
Albumin: 3.9 g/dL (ref 3.8–4.8)
Alkaline Phosphatase: 101 IU/L (ref 39–117)
Anion Gap: 17 mmol/L (ref 10.0–18.0)
BUN/Creatinine Ratio: 9 (ref 9–23)
BUN: 6 mg/dL (ref 6–24)
Bilirubin Total: 0.4 mg/dL (ref 0.0–1.2)
CO2: 19 mmol/L — ABNORMAL LOW (ref 20–29)
Calcium: 8.8 mg/dL (ref 8.7–10.2)
Chloride: 98 mmol/L (ref 96–106)
Creatinine, Ser: 0.66 mg/dL (ref 0.57–1.00)
GFR calc Af Amer: 126 mL/min/{1.73_m2} (ref >59)
GFR calc non Af Amer: 109 mL/min/{1.73_m2} (ref >59)
Globulin, Total: 3 g/dL (ref 1.5–4.5)
Glucose: 169 mg/dL — ABNORMAL HIGH (ref 65–99)
Potassium: 3.8 mmol/L (ref 3.5–5.2)
Sodium: 134 mmol/L (ref 134–144)
Total Protein: 6.9 g/dL (ref 6.0–8.5)

## 2019-07-13 NOTE — Assessment & Plan Note (Signed)
On maximum dose of metformin, Januvia 100 mg daily, and glimepiride 4 mg.  Semaglutide started during last visit.  She did run out of semaglutide 1 week ago and stopped taking Metformin 2 weeks ago after receiving a different shape of pill from the pharmacy (she is blind and relies on shape and sound of pills to recognize medications). A1c improving 9.1 --> 8.4.  -Refilled semaglutide 7 mg -Continue maximum dose metformin (confimed with pharmacy she was given metformin from different manufacturer), glimepiride, Januvia, and atorvastatin  -She continues to decline injectables due to blindness - Up to date with foot exam

## 2019-07-13 NOTE — Assessment & Plan Note (Signed)
Found incidentally on MRI abdomen ordered for evaluation of abdominal hernia. 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  No Hounsfield units or contrast washout reported.  Unfortunately, she had a recent reaction to IV contrast (diffuse rash, N/V) and unable to repeat contrast imaging at this time.  Ideally would evaluate for subclinical Cushing syndrome and pheochromocytoma.  Ordered plasma metanephrines today.  However, I am unable to coordinate a LDST for her she is blind, works, and does not have reliable transportation (unable to come in AM for labs). Will reach to endocrinology for assistance.

## 2019-07-13 NOTE — Assessment & Plan Note (Signed)
Patient has 2 verrucae in lateral neck that she would like removed. She has not noticed any growth in size and there are no signs of infection on exam. I placed a referral to dermatology for this.

## 2019-07-15 NOTE — Progress Notes (Signed)
Internal Medicine Clinic Attending  Case discussed with Dr. Isac Sarna at the time of the visit.  We reviewed the resident's history and exam and pertinent patient test results.  I agree with the assessment, diagnosis, and plan of care documented in the resident's note.   Difficult case of adrenal incidentaloma unable to obtain routine work up due to IV contrast allergy and transportation issues due to blindness. Dr. Frederico Hamman will reach out to endocrinology for recommendations.

## 2019-07-20 LAB — METANEPHRINES, PLASMA
Metanephrine, Free: <10 pg/mL (ref 0.0–88.0)
Normetanephrine, Free: 47.1 pg/mL (ref 0.0–125.8)

## 2019-08-26 ENCOUNTER — Ambulatory Visit: Payer: Medicare Other | Attending: Internal Medicine

## 2019-08-26 DIAGNOSIS — Z23 Encounter for immunization: Secondary | ICD-10-CM

## 2019-08-26 NOTE — Progress Notes (Signed)
   Covid-19 Vaccination Clinic  Name:  Samantha Petersen    MRN: [id number] DOB: 1975-08-25  08/26/2019  [name] was observed post Covid-19 immunization for 15 minutes without incident. She was provided with Vaccine Information Sheet and instruction to access the V-Safe system.   [name] was instructed to call 911 with any severe reactions post vaccine: Marland Kitchen Difficulty breathing  . Swelling of face and throat  . A fast heartbeat  . A bad rash all over body  . Dizziness and weakness   Immunizations Administered    Name Date Dose VIS Date Route   Pfizer COVID-19 Vaccine 08/26/2019  8:48 AM 0.3 mL 05/21/2019 Intramuscular   Manufacturer: [place]   Lot: [id number]   [place]: [id number]

## 2019-09-13 ENCOUNTER — Other Ambulatory Visit: Payer: Self-pay | Admitting: Internal Medicine

## 2019-09-13 DIAGNOSIS — E118 Type 2 diabetes mellitus with unspecified complications: Secondary | ICD-10-CM

## 2019-09-15 ENCOUNTER — Other Ambulatory Visit: Payer: Self-pay | Admitting: Internal Medicine

## 2019-09-20 ENCOUNTER — Ambulatory Visit: Payer: Medicare Other | Attending: Internal Medicine

## 2019-09-20 DIAGNOSIS — Z23 Encounter for immunization: Secondary | ICD-10-CM

## 2019-10-12 ENCOUNTER — Other Ambulatory Visit: Payer: Self-pay | Admitting: Internal Medicine

## 2019-10-18 ENCOUNTER — Other Ambulatory Visit: Payer: Self-pay

## 2019-10-18 ENCOUNTER — Encounter: Payer: Self-pay | Admitting: Internal Medicine

## 2019-10-18 ENCOUNTER — Ambulatory Visit: Payer: Medicare Other | Admitting: Internal Medicine

## 2019-10-18 VITALS — BP 118/66 | HR 84 | Ht 73.0 in | Wt 283.6 lb

## 2019-10-18 DIAGNOSIS — E119 Type 2 diabetes mellitus without complications: Secondary | ICD-10-CM | POA: Diagnosis not present

## 2019-10-18 DIAGNOSIS — Z7984 Long term (current) use of oral hypoglycemic drugs: Secondary | ICD-10-CM | POA: Diagnosis not present

## 2019-10-18 DIAGNOSIS — E1165 Type 2 diabetes mellitus with hyperglycemia: Secondary | ICD-10-CM | POA: Diagnosis not present

## 2019-10-18 DIAGNOSIS — I10 Essential (primary) hypertension: Secondary | ICD-10-CM

## 2019-10-18 DIAGNOSIS — R5383 Other fatigue: Secondary | ICD-10-CM | POA: Diagnosis not present

## 2019-10-18 DIAGNOSIS — F329 Major depressive disorder, single episode, unspecified: Secondary | ICD-10-CM

## 2019-10-18 LAB — POCT GLYCOSYLATED HEMOGLOBIN (HGB A1C): Hemoglobin A1C: 9.4 % — AB (ref 4.0–5.6)

## 2019-10-18 LAB — GLUCOSE, CAPILLARY: Glucose-Capillary: 246 mg/dL — ABNORMAL HIGH (ref 70–99)

## 2019-10-18 MED ORDER — LISINOPRIL-HYDROCHLOROTHIAZIDE 10-12.5 MG PO TABS: ORAL_TABLET | ORAL | 1 refills | Status: DC

## 2019-10-18 NOTE — Assessment & Plan Note (Addendum)
Patient presents for DM follow up. Unfortunately remains uncontrolled with A1c climbing up, 9.4 today. She reports compliance with metformin, Amaryl, and Januvia. Started on semaglutide in 07/2019 but states never received medication from pharmacy. She is unclear why and pharmacy has not notified me of this. Attempted to call pharmacy during visit but no response. In addition to this, she does not adhere to a low carbohydrate diet as she is blind and relied on fast foods or meals that friends bring her.   - Continue metformin, amaryl, and Januvia. Has been on SGLT2-inhibitor in the past and unable to tolerate it due to severe vulvovaginal candidiasis. Also unable to do injectables as she is blind and does not have reliable support at home. Will call pharmacy to find out why she has not received semaglutide as she did responded very well to this medication - Up to date with foot exam - On high intensity statin and ACEi  - Referral to CCM, hopefully we can find resources in the community that can help her navigate the healthcare system now that she is alone and without a support system

## 2019-10-18 NOTE — Assessment & Plan Note (Addendum)
Ran out of meds 5 days ago and has been using a friend's BP meds. Strongly advised against this and refilled medications.
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
## 2019-10-18 NOTE — Patient Instructions (Addendum)
Dalylah,   Te envie un refill de tu pastilla de la presion.   Trate de llamar a la clinica para verficiar lo de las pastillas de la diabetes pero no me contestaron. Cuando hable con ellos te dejare saber que me dicen.   Para el sueo y cansancio que tienes te hice analisis de [place]. Los resultados me llegan mas tarde en la semana y te llamo para dejarte saber como salieron.   Hice un referrido para un trabajador social. Si no te contactan este mes dejame saber.   Haz una cita seguimiento en 3 meses o antes si lo necesitas.   - Dra. Frederico Hamman

## 2019-10-19 LAB — CBC WITH DIFFERENTIAL/PLATELET
Basophils Absolute: 0.1 10*3/uL (ref 0.0–0.2)
Basos: 1 %
EOS (ABSOLUTE): 0.2 10*3/uL (ref 0.0–0.4)
Eos: 2 %
Hematocrit: 37.8 % (ref 34.0–46.6)
Hemoglobin: 12.1 g/dL (ref 11.1–15.9)
Immature Grans (Abs): 0 10*3/uL (ref 0.0–0.1)
Immature Granulocytes: 0 %
Lymphocytes Absolute: 3.6 10*3/uL — ABNORMAL HIGH (ref 0.7–3.1)
Lymphs: 31 %
MCH: 24.2 pg — ABNORMAL LOW (ref 26.6–33.0)
MCHC: 32 g/dL (ref 31.5–35.7)
MCV: 76 fL — ABNORMAL LOW (ref 79–97)
Monocytes Absolute: 0.7 10*3/uL (ref 0.1–0.9)
Monocytes: 6 %
Neutrophils Absolute: 7.1 10*3/uL — ABNORMAL HIGH (ref 1.4–7.0)
Neutrophils: 60 %
Platelets: 298 10*3/uL (ref 150–450)
RBC: 5 x10E6/uL (ref 3.77–5.28)
RDW: 15.1 % (ref 11.7–15.4)
WBC: 11.7 10*3/uL — ABNORMAL HIGH (ref 3.4–10.8)

## 2019-10-19 LAB — CMP14 + ANION GAP
ALT: 35 IU/L — ABNORMAL HIGH (ref 0–32)
AST: 33 IU/L (ref 0–40)
Albumin/Globulin Ratio: 1.1 — ABNORMAL LOW (ref 1.2–2.2)
Albumin: 3.6 g/dL — ABNORMAL LOW (ref 3.8–4.8)
Alkaline Phosphatase: 125 IU/L — ABNORMAL HIGH (ref 39–117)
Anion Gap: 18 mmol/L (ref 10.0–18.0)
BUN/Creatinine Ratio: 12 (ref 9–23)
BUN: 6 mg/dL (ref 6–24)
Bilirubin Total: 0.2 mg/dL (ref 0.0–1.2)
CO2: 19 mmol/L — ABNORMAL LOW (ref 20–29)
Calcium: 9.2 mg/dL (ref 8.7–10.2)
Chloride: 98 mmol/L (ref 96–106)
Creatinine, Ser: 0.52 mg/dL — ABNORMAL LOW (ref 0.57–1.00)
GFR calc Af Amer: 136 mL/min/{1.73_m2} (ref >59)
GFR calc non Af Amer: 118 mL/min/{1.73_m2} (ref >59)
Globulin, Total: 3.2 g/dL (ref 1.5–4.5)
Glucose: 247 mg/dL — ABNORMAL HIGH (ref 65–99)
Potassium: 3.8 mmol/L (ref 3.5–5.2)
Sodium: 135 mmol/L (ref 134–144)
Total Protein: 6.8 g/dL (ref 6.0–8.5)

## 2019-10-19 LAB — FERRITIN: Ferritin: 17 ng/mL (ref 15–150)

## 2019-10-19 LAB — TSH: TSH: 1.03 u[IU]/mL (ref 0.450–4.500)

## 2019-10-21 ENCOUNTER — Encounter: Payer: Self-pay | Admitting: Internal Medicine

## 2019-10-21 DIAGNOSIS — R5383 Other fatigue: Secondary | ICD-10-CM | POA: Insufficient documentation

## 2019-10-21 MED ORDER — IRON (FERROUS SULFATE) 325 (65 FE) MG PO TABS: 325.0000 mg | ORAL_TABLET | ORAL | 3 refills | Status: DC

## 2019-10-21 NOTE — Assessment & Plan Note (Signed)
Patient complaints of fatigue and sleepiness x 2 weeks. She reports this is not associated with other symptoms. States this is how she feels when she has had iron deficiency anemia in the past and is requesting we check her Hgb. Had an episode of depression last year after her father died but reports feeling much better now. 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   - CBC, CMP, TSH and ferritin --> Hgb is within normal but ferritin low. Sent ferrous sulfate to pharmacy to take QD or every other day. Left message with this information in patient's voicemail as she is unable to answer phone while she is at work.  - Sleep study  - Will need to improve glycemic control

## 2019-10-21 NOTE — Progress Notes (Signed)
Internal Medicine Clinic Attending  Case discussed with Dr. Santos-Sanchez at the time of the visit.  We reviewed the resident's history and exam and pertinent patient test results.  I agree with the assessment, diagnosis, and plan of care documented in the resident's note.  Renuka Farfan, M.D., Ph.D.  

## 2019-10-21 NOTE — Addendum Note (Signed)
Addended by: Hulan Fray on: 10/21/2019 06:49 PM   Modules accepted: Orders

## 2019-10-25 ENCOUNTER — Other Ambulatory Visit: Payer: Self-pay | Admitting: *Deleted

## 2019-10-25 ENCOUNTER — Telehealth: Payer: Self-pay | Admitting: Internal Medicine

## 2019-10-25 DIAGNOSIS — E119 Type 2 diabetes mellitus without complications: Secondary | ICD-10-CM

## 2019-10-25 MED ORDER — ATORVASTATIN CALCIUM 20 MG PO TABS: 20.0000 mg | ORAL_TABLET | Freq: Every day | ORAL | 3 refills | Status: DC

## 2019-10-25 MED ORDER — METFORMIN HCL ER 500 MG PO TB24: ORAL_TABLET | ORAL | 3 refills | Status: DC

## 2019-10-25 MED ORDER — PRODIGY VOICE BLOOD GLUCOSE W/DEVICE KIT: PACK | 0 refills | Status: DC

## 2019-10-25 NOTE — Telephone Encounter (Signed)
Order should be cancelled for now.

## 2019-10-25 NOTE — Telephone Encounter (Signed)
Rec'd phone call from th [facility] in reference to the order placed for a home Sleep Study.  They have contacted the patient who states she is completely blind and has no help at home that can assist with the Home Sleep Study.  Per the Supervisor Wilcox at  [facility], the are unable to provider an aide to sit with her if she were to do an In lab Study.  Per [facility] Please advise what you recommend or should this order be cancelled for now.

## 2019-10-27 ENCOUNTER — Ambulatory Visit: Payer: Self-pay

## 2019-10-27 NOTE — Chronic Care Management (AMB) (Signed)
  Chronic Care Management   Outreach Note  10/27/2019 Name: Samantha Petersen MRN: [id number] DOB: [birth year]  Referred by: Welford Roche, MD Reason for referral : Care Coordination (Disease Management,Medication Assistance)   An unsuccessful telephone outreach was attempted today. The patient was referred to the case management team for assistance with care management and care coordination.   Follow Up Plan: A HIPPA compliant phone message was left for the patient via [place] Verdis Frederickson [phone]) providing contact information and requesting a return call.      Ronn Melena, [place] Coordination Social Worker [place] [phone]

## 2019-11-02 ENCOUNTER — Ambulatory Visit: Payer: Self-pay

## 2019-11-02 NOTE — Chronic Care Management (AMB) (Signed)
  Chronic Care Management   Outreach Note  11/02/2019 Name: Samantha Petersen MRN: [id number] DOB: 11/29/1975  Referred by: Welford Roche, MD Reason for referral : Care Coordination (Disease Management, Medication Assistance)   A second unsuccessful telephone outreach was attempted today. The patient was referred to the case management team for assistance with care management and care coordination.   Follow Up Plan: A HIPPA compliant phone message was via [place] [phone]) left for the patient providing contact information and requesting a return call.    Ronn Melena, [place] Coordination Social Worker [place] [phone]

## 2019-11-15 ENCOUNTER — Ambulatory Visit: Payer: Self-pay

## 2019-11-15 NOTE — Progress Notes (Signed)
Internal Medicine Clinic Resident  I have personally reviewed this encounter including the documentation in this note and/or discussed this patient with the care management provider. I will address any urgent items identified by the care management provider and will communicate my actions to the patient's PCP. CCM was unable to reach patient. I have reviewed the patient's CCM visit with my supervising attending, Dr Heber Girard.  Asencion Noble, MD 11/15/2019

## 2019-11-15 NOTE — Chronic Care Management (AMB) (Addendum)
  Chronic Care Management   Outreach Note  11/15/2019 Name: Samantha Petersen MRN: [id number] DOB: [birth year]  Referred by: Welford Roche, MD Reason for referral : Care Coordination (Disease management, medication assistance)   Third unsuccessful telephone outreach via [employer] was attempted today. The patient was referred to the case management team for assistance with care management and care coordination. The patient's primary care provider has been notified of our unsuccessful attempts to make or maintain contact with the patient. The care management team is pleased to engage with this patient at any time in the future should he/she be interested in assistance from the care management team.     Addendum:  In-basket message received from Dr. Frederico Hamman requesting call to patient at noon or after 5:00 PM due to work schedule.  Unsuccessful outreach via [employer] at 12:00 PM.  Interpreter left message for patient requesting call back.  Will attempt to reach patient after 5:00 PM next week.     Samantha Petersen, [place] Coordination Social Worker [place] [phone]

## 2019-11-22 ENCOUNTER — Ambulatory Visit: Payer: Self-pay

## 2019-11-22 NOTE — Chronic Care Management (AMB) (Signed)
°  Chronic Care Management   Outreach Note  11/22/2019 Name: Samantha Petersen MRN: [id number] DOB: Apr 22, 1976  Referred by: Welford Roche, MD Reason for referral : Care Coordination [facility] Resources )   Fifth unsuccessful telephone outreach was attempted today via [employer]. Patient contacted after 5:00 PM as requested by provider.  Voicemail messages left on home and mobile numbers requesting return call. The patient was referred to the case management team for assistance with care management and care coordination. The patient's primary care provider has been notified of our unsuccessful attempts to make or maintain contact with the patient. The care management team is pleased to engage with this patient at any time in the future should he/she be interested in assistance from the care management team.     Ronn Melena, [place] Worker [place] [phone]

## 2019-11-23 ENCOUNTER — Ambulatory Visit: Payer: Self-pay

## 2019-11-23 NOTE — Chronic Care Management (AMB) (Signed)
°  Care Management   Social Work Note  11/23/2019 Name: Kendall Arnell MRN: [id number] DOB: [birth year]  Brittainy Bucker is a 44 y.o. year old female who sees Welford Roche, MD for primary care. The Care Management team was consulted for assistance with [employer] .   SDOH (Social Determinants of Health) assessments performed: No     Received voicemail message from patient after office hours on 11/22/19.  Patient reports that her lunch break is from 11:20 AM-11:40 AM, Monday through Friday.  Patient states that she gets home from work by approximately 4:30 PM. Contacted patient via [employer] today during her lunch break.    Follow Up Plan: Appointment scheduled for SW assessment by phone on: 11/29/19 @ 4:45PM.       Ronn Melena, [place] Coordination Social Worker [place] [phone]

## 2019-11-29 ENCOUNTER — Telehealth: Payer: Medicare Other | Admitting: *Deleted

## 2019-11-29 ENCOUNTER — Ambulatory Visit: Payer: Self-pay | Admitting: *Deleted

## 2019-11-29 DIAGNOSIS — E119 Type 2 diabetes mellitus without complications: Secondary | ICD-10-CM

## 2019-11-29 DIAGNOSIS — I10 Essential (primary) hypertension: Secondary | ICD-10-CM

## 2019-11-29 NOTE — Chronic Care Management (AMB) (Signed)
  Chronic Care Management   Note  11/29/2019 Name: Samantha Petersen MRN: [id number] DOB: [birth year]  Using [employer] [phone], successful outreach to patient via mobile number to reschedule telephone appointment with Amber Chrismon for 12/01/19 at 4:45 pm.  Follow up plan: Telephone follow up appointment with care management team member scheduled for: 12/01/19 at 4:45 pm  Kelli Churn RN, CCM, [place] [facility] RN Care Manager [phone]

## 2019-12-01 ENCOUNTER — Ambulatory Visit: Payer: Medicare Other

## 2019-12-01 DIAGNOSIS — I10 Essential (primary) hypertension: Secondary | ICD-10-CM

## 2019-12-01 DIAGNOSIS — H543 Unqualified visual loss, both eyes: Secondary | ICD-10-CM

## 2019-12-01 NOTE — Chronic Care Management (AMB) (Addendum)
  Care Management   Social Work Note  12/01/2019 Name: Samantha Petersen MRN: [id number] DOB: 15-Mar-1976  [name] is a 44 y.o. year old female who sees Welford Roche, MD for primary care. The Care Management team was consulted for assistance with navigating healthcare system, obtaining/affording medications/community resources.  Successful outreach to patient today via [employer]([id number]).  Explained CCM services and reason for referral.  At the beginning of our conversation, patient stated that she is having "a lot of emotions" after the loss of her father and she does not know how to deal with it.  Informed her that clinic has counseling services and offered to submit referral.  Patient stated, "I don't need a counselor.  I went to school for psychology.  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    Patient states that she previously applied for food stamps but did not qualify for assistance.  Inquired if patient has applied for Medicaid; said that she was told by representative of Department of Social Services that she makes too much money to qualify.  Patient stated "I have Medicare, I don't need Medicaid"  However, she did acknowledge that it is sometimes difficult to afford the cost of medications.  Offered to submit referral for possible medication assistance but she declined.   Talked with patient about Independent Living Program through [facility] Division of Services for the Blind and offered to submit referral for assistance.  Patient stated that she has already received this type of assistance and "I know what services are available for the blind"    Inquired about what specific assistance that would be of benefit to patient.  Patient explained that she has coworkers that receive "social work" assistance in which someone provides transportation  to [employer], pharmacy, etc.  She said these people also assist with in-home tasks such as laundry, meal preparation, and house keeping.  Informed patient that these services are very likely [place] which are only covered by Medicaid or private pay.  Patient became upset at this time of the call stating that she already told me she does not need Medicaid.  Reiterated that PCS is only covered by Medicaid and that is only reason that it was referenced again.   Patient said that coworkers are getting services through "Social Services"  Attempted to explain to patient again that it's likely PCS that they are receiving.   Patient then stated "I just need to know what services are available in [facility] for blind people"  Reminded her that referral could be made to [facility] Division of Services for the Blind.  Patient became upset again stating that she already told me she had received services through them.   Attempted multiple times to deescalate patient without success.  Offered to contact caseworker at [place] to get clarification about services that patient was referencing.   Patient continued to raise her voice and eventually disconnected call.     Ronn Melena, [place] Coordination Social Worker [place] [phone]

## 2019-12-03 NOTE — Progress Notes (Signed)
Internal Medicine Clinic Attending  CCM services provided by the care management provider and their documentation were reviewed with Dr. Basaraba.  We reviewed the pertinent findings, urgent action items addressed by the resident and non-urgent items to be addressed by the PCP.  I agree with the assessment, diagnosis, and plan of care documented in the CCM and resident's note.  Marlen Mollica N Margi Edmundson, MD 12/03/2019  

## 2019-12-03 NOTE — Progress Notes (Signed)
Internal Medicine Clinic Resident  I have personally reviewed this encounter including the documentation in this note and/or discussed this patient with the care management provider. I will address any urgent items identified by the care management provider and will communicate my actions to the patient's PCP. I have reviewed the patient's CCM visit with my supervising attending, Dr Raines.  Samantha Blasingame, MD 12/03/2019    

## 2019-12-09 ENCOUNTER — Other Ambulatory Visit: Payer: Self-pay | Admitting: Internal Medicine

## 2019-12-10 NOTE — Telephone Encounter (Signed)
Tried to call patient with the use of [employer] Interpre[employer] services to discuss the indication for her ibuprofen refill request but was unable to reach patient at this time.  Spanish Interpreter left a voicemail to call back at [phone].  Jeralyn Bennett, PGY1 Internal Medicine Pager: [phone]

## 2019-12-14 ENCOUNTER — Other Ambulatory Visit: Payer: Self-pay | Admitting: Internal Medicine

## 2019-12-14 MED ORDER — IBUPROFEN 600 MG PO TABS: ORAL_TABLET | ORAL | 0 refills | Status: AC

## 2019-12-14 NOTE — Telephone Encounter (Signed)
PLS CONTACT PT [phone]

## 2019-12-14 NOTE — Telephone Encounter (Signed)
Spoke with [name] using [employer] [phone].  She states that she continues to have left wrist pain, back pain, breast pain, and right knee pain overlying an ulcer she has and requests an ibuprofen refill.  Will prescribe 1 week of ibuprofen 600mg  PRN to hold her over until her next appointment.  Patient also has concerns regarding her diabetes medications and management and concerns regarding depression at this time.  Instructed patient to call the clinic at [phone] to make an appointment to be seen in one week to establish care with me to address these concerns of hers.  Jeralyn Bennett, PGY1 Internal Medicine Pager: [phone]

## 2019-12-16 ENCOUNTER — Other Ambulatory Visit: Payer: Self-pay | Admitting: Internal Medicine

## 2020-01-04 ENCOUNTER — Ambulatory Visit: Payer: Medicare Other | Admitting: Student

## 2020-01-04 ENCOUNTER — Encounter: Payer: Self-pay | Admitting: Student

## 2020-01-04 VITALS — BP 133/66 | HR 109 | Temp 98.7°F | Ht 73.0 in | Wt 283.7 lb

## 2020-01-04 DIAGNOSIS — Z1231 Encounter for screening mammogram for malignant neoplasm of breast: Secondary | ICD-10-CM

## 2020-01-04 DIAGNOSIS — D242 Benign neoplasm of left breast: Secondary | ICD-10-CM

## 2020-01-04 DIAGNOSIS — F329 Major depressive disorder, single episode, unspecified: Secondary | ICD-10-CM

## 2020-01-04 DIAGNOSIS — D509 Iron deficiency anemia, unspecified: Secondary | ICD-10-CM

## 2020-01-04 DIAGNOSIS — Z716 Tobacco abuse counseling: Secondary | ICD-10-CM

## 2020-01-04 DIAGNOSIS — E119 Type 2 diabetes mellitus without complications: Secondary | ICD-10-CM

## 2020-01-04 DIAGNOSIS — F17200 Nicotine dependence, unspecified, uncomplicated: Secondary | ICD-10-CM

## 2020-01-04 DIAGNOSIS — Z7984 Long term (current) use of oral hypoglycemic drugs: Secondary | ICD-10-CM

## 2020-01-04 DIAGNOSIS — I10 Essential (primary) hypertension: Secondary | ICD-10-CM

## 2020-01-04 DIAGNOSIS — F32A Depression, unspecified: Secondary | ICD-10-CM

## 2020-01-04 DIAGNOSIS — F419 Anxiety disorder, unspecified: Secondary | ICD-10-CM

## 2020-01-04 DIAGNOSIS — R5383 Other fatigue: Secondary | ICD-10-CM | POA: Diagnosis not present

## 2020-01-04 DIAGNOSIS — H543 Unqualified visual loss, both eyes: Secondary | ICD-10-CM

## 2020-01-04 LAB — GLUCOSE, CAPILLARY: Glucose-Capillary: 245 mg/dL — ABNORMAL HIGH (ref 70–99)

## 2020-01-04 LAB — POCT GLYCOSYLATED HEMOGLOBIN (HGB A1C): Hemoglobin A1C: 9.8 % — AB (ref 4.0–5.6)

## 2020-01-04 MED ORDER — BUPROPION HCL ER (SR) 150 MG PO TB12: 150.0000 mg | ORAL_TABLET | Freq: Two times a day (BID) | ORAL | 3 refills | Status: DC

## 2020-01-04 MED ORDER — SERTRALINE HCL 50 MG PO TABS: 50.0000 mg | ORAL_TABLET | Freq: Every day | ORAL | 3 refills | Status: DC
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
## 2020-01-04 NOTE — Patient Instructions (Addendum)
Today, we discussed that we will start sertraline once daily for anxiety and mood as well as bupropion, 1 pill daily for the first 3 days then twice daily for smoking cessation.   I will refer you to an endocrinologist and for a mammogram.  Please don't hesitate to call with any questions.  Please see [place] back in clinic in 6 weeks.  It was a pleasure meeting you.  Dr. Jeralyn Bennett  -----  Anson Crofts, discutimos que comenzaremos con sertralina una vez al da para la ansiedad y el estado de nimo, as como con bupropin, 1 pastilla al [employer] primeros 3 das y [employer] veces al da para dejar de fumar.  La derivar a Technical brewer y para Lavinia Sharps.  No dude en llamarnos si tiene alguna pregunta. Vuelva a vernos en la clnica en 6 semanas.  Fue Engineer, technical sales.  Dra. Jeralyn Bennett  Bupropion sustained-release tablets (smoking cessation) O que  este medicamento? A BUPROPIONA  usada para ajudar as pessoas a parar de fumar. Este medicamento pode ser usado para outros propsitos; em caso de dvidas, pergunte ao seu profissional de sade ou farmacutico. NOMES DE MARCAS COMUNS: Buproban, Zyban O que devo dizer a meu profissional de sade antes de tomar este medicamento? Precisam saber se voc tem algum dos seguintes problemas ou estados de sade:  transtornos alimentares (por exemplo, anorexia ou bulimia)  transtorno bipolar ou psicose  diabetes ou glicemia alta tratada com medicamentos  glaucoma  traumatismo craniano ou tumor no crebro  histria pregressa de insuficincia cardaca, ataque do corao ou outra doena cardaca  batimento cardaco irregular  presso alta  doenas renais ou hepticas  convulses (crises convulsivas)  pensamentos suicidas ou planos de cometer suicdio  tentativa de suicdio (sua ou de um parente)  sndrome de Tourette  perda de peso  reao estranha ou alergia  bupropiona  reao estranha ou alergia a outros  medicamentos  reao estranha ou alergia a alimentos, corantes ou conservantes  est amamentando  est grvida ou tentando [employer] devo usar este medicamento? Tome este medicamento por via oral com um copo d'gua. Siga as instrues na embalagem ou na bula. Voc pode tomar este medicamento com ou sem comida. Se este medicamento lhe fizer mal ao estmago, tome-o com comida. No parta, esmague ou [employer]. Tome este medicamento em intervalos regulares. Se toma este medicamento mais de uma vez ao dia, espere pelo menos 8 horas entre uma dose [place]. Para diminuir a dificuldade de dormir, evite tomar este medicamento ao deitar. No tome este medicamento com frequncia maior do que a indicada. No pare de tomar [employer] subitamente, exceto sob aconselhamento mdico. Interromper o uso desta medicao muito rapidamente pode causar efeitos secundrios graves. O farmacutico lhe dar um folheto informativo especial a cada compra do medicamento. No se esquea de ler atentamente essas informaes todas as vezes. Fale com seu pediatra a respeito do uso deste medicamento em crianas. Pode ser preciso tomar alguns cuidados especiais. Superdosagem: Se achar que tomou uma superdosagem deste medicamento, entre em contato imediatamente com o [facility] de Intoxicaes ou v a [employer]. OBSERVAO: Este medicamento  s para voc. No compartilhe este medicamento com outras pessoas. E se eu deixar de tomar uma dose? Se perder uma dose, pule a dose perdida e tome a prxima dose marcada. Espere pelo menos 8 horas entre uma dose [employer]. No tome o remdio em dobro, nem tome uma dose adicional. O que  pode interagir com este medicamento? No tome este medicamento com nenhum dos seguintes:  linezolida  alguns medicamentos chamados inibidores da MAO, como Azilect, [place], Eldepryl, [place], [place] e Parnate  azul de metileno (injetado na veia)  outros medicamentos  que contenham bupropiona, como o Wellbutrin Este medicamento tambm pode interagir com os seguintes remdios:  lcool  alguns medicamentos para ansiedade ou problemas de sono  alguns medicamentos para hipertenso, como metoprolol e propranolol  alguns medicamentos para depresso ou transtornos psicticos  alguns medicamentos contra a infeco pelo HIV ou AIDS, como efiravenz, lopinavir, nelfinavir, ritonavir  alguns medicamentos para controlar o ritmo cardaco, como flecainida ou propafenona  alguns medicamentos para mal de Parkinson, como amantadina, levodopa  alguns medicamentos para crises convulsivas, como carbamazepina, fenobarbital, fenitona  cimetidina  clopidogrel  ciclofosfamida  digoxina  furazolidona  isoniazida  nicotina  orfenadrina  procarbazina  corticoides, como prednisona ou cortisona  medicamentos estimulantes para transtornos de Designer, jewellery, para perder peso ou para Public affairs consultant acordado  tamoxifeno  teofilina  tiotepa  ticlopidina  tramadol  varfarina Esta lista pode no descrever todas as interaes possveis. D ao seu profissional de sade uma lista de todos os medicamentos, ervas medicinais, remdios de venda livre, ou suplementos alimentares que voc [place]. Diga tambm se voc fuma, bebe, ou [place] drogas ilcitas. Alguns destes podem interagir com o seu medicamento. Ao que devo ficar atento quando estiver [employer] medicamento? Consulte seu mdico ou profissional de sade para realizar um acompanhamento regular Diplomatic Services operational officer. Este [employer] deve ser utilizado em conjunto com um programa de apoio [place].  importante participar de um programa comportamental, de aconselhamento ou outro programa de apoio que tenha sido recomendado pelo seu mdico ou profissional de sade. Este medicamento pode provocar reaes cutneas graves. Estas podem ocorrer semanas ou meses depois de comear a tomar o medicamento. Em caso de febre ou sintomas  gripais com erupo cutnea, entre em contato com seu mdico ou profissional de sade imediatamente. A erupo cutnea pode ter cor vermelha ou roxa e depois desenvolver bolhas ou descamao da pele. Ou poder notar uma erupo cutnea vermelha com inchao da face, dos lbios ou dos gnglios linfticos no pescoo ou [employer]. Os pacientes e seus familiares devem ficar atentos a uma possvel recorrncia ou piora da depresso ou pensamentos suicidas. Tambm devem ficar atentos a qualquer Hershey Company ou grave nas sensaes do paciente, como ansiedade, [place], [place], [place], agressividade, impulsividade, forte inquietao, excitao excessiva, hiperatividade ou insnia. Se isso acontecer, principalmente no comeo do tratamento ou aps uma mudana de dose, entre em contato com seu mdico ou profissional de sade. Evite bebidas alcolicas enquanto estiver [employer]. Consumir bebidas alcolicas em excesso, usar sonferos ou medicamentos para ansiedade, ou parar o uso desses medicamentos de repente Patent examiner tomando este medicamento pode aumentar o risco de convulso. No dirija, no opere mquinas e no faa nada que exija concentrao mental at [employer] como o medicamento lhe afeta. Este medicamento pode prejudicar a sua capacidade para realizar essas tarefas. No tome este medicamento perto da hora de Financial risk analyst. Ele pode lhe impedir de dormir. Voc pode ficar com a boca seca. Mascar chiclete sem acar ou chupar balas, alm de beber bastante gua, pode ajudar. Entre em contato com seu mdico se o problema Writer. Enquanto estiver [employer], no use adesivos nem chiclete de nicotina sem orientao do seu mdico ou profissional de sade. Se o seu mdico recomendar o uso conjunto de nicotina com  este medicamento, voc pode precisar medir a presso regularmente. Que efeitos colaterais posso sentir aps usar este medicamento? Efeitos  colaterais que devem ser informados ao seu mdico ou profissional de sade o mais rpido possvel:  reaes alrgicas, como erupo na pele, coceira, urticria, ou inchao do rosto, dos lbios ou da lngua  dificuldade para respirar  alteraes na viso  confuso  humor elevado, diminuio da necessidade de sono, pensamentos descontrolados, comportamento impulsivo  batimento cardaco acelerado ou irregular  alucinaes, perda do contato com a realidade  aumento da presso arterial  erupo cutnea, febre e gnglios linfticos inchados  vermelhido, bolhas, descamao ou afrouxamento da pele, inclusive dentro da boca  convulses (crises convulsivas)  pensamentos suicidas ou outras alteraes do humor  fraqueza ou cansao fora do comum  vmitos Efeitos colaterais que normalmente no precisam de cuidados mdicos (avise ao seu mdico ou profissional de sade se persistirem ou forem incmodos):  priso de ventre  dor de cabea  perda de apetite  enjoo  tremores  perda de peso Esta lista pode no descrever todos os efeitos colaterais possveis. Para mais orientaes sobre efeitos colaterais, consulte o seu mdico. Voc pode relatar a ocorrncia de efeitos colaterais  FDA pelo telefone [phone]. Onde devo guardar meu medicamento? [facility] fora do [employer]. Conservar em temperatura ambiente, entre 20 e 25 degreesC [phone] e 77 degreesF). Proteger Administrator, arts. Manter o recipiente bem fechado. Descartar qualquer medicamento no utilizado aps a data de validade impressa no rtulo ou embalagem. OBSERVAO: Este folheto  um resumo. Pode no cobrir todas as informaes possveis. Se tiver dvidas a respeito deste medicamento, fale com seu mdico, farmacutico ou profissional de sade.  2020 Elsevier/Gold Standard (2018-10-26 00:00:00)

## 2020-01-05 ENCOUNTER — Telehealth: Payer: Self-pay | Admitting: Student

## 2020-01-05 ENCOUNTER — Encounter: Payer: Self-pay | Admitting: Student

## 2020-01-05 LAB — CBC
Hematocrit: 38.6 % (ref 34.0–46.6)
Hemoglobin: 11.9 g/dL (ref 11.1–15.9)
MCH: 22.8 pg — ABNORMAL LOW (ref 26.6–33.0)
MCHC: 30.8 g/dL — ABNORMAL LOW (ref 31.5–35.7)
MCV: 74 fL — ABNORMAL LOW (ref 79–97)
Platelets: 277 10*3/uL (ref 150–450)
RBC: 5.23 x10E6/uL (ref 3.77–5.28)
RDW: 16.4 % — ABNORMAL HIGH (ref 11.7–15.4)
WBC: 11.9 10*3/uL — ABNORMAL HIGH (ref 3.4–10.8)

## 2020-01-05 LAB — BMP8+ANION GAP
Anion Gap: 17 mmol/L (ref 10.0–18.0)
BUN/Creatinine Ratio: 10 (ref 9–23)
BUN: 5 mg/dL — ABNORMAL LOW (ref 6–24)
CO2: 20 mmol/L (ref 20–29)
Calcium: 8.5 mg/dL — ABNORMAL LOW (ref 8.7–10.2)
Chloride: 94 mmol/L — ABNORMAL LOW (ref 96–106)
Creatinine, Ser: 0.5 mg/dL — ABNORMAL LOW (ref 0.57–1.00)
GFR calc Af Amer: 138 mL/min/{1.73_m2} (ref >59)
GFR calc non Af Amer: 120 mL/min/{1.73_m2} (ref >59)
Glucose: 248 mg/dL — ABNORMAL HIGH (ref 65–99)
Potassium: 3.9 mmol/L (ref 3.5–5.2)
Sodium: 131 mmol/L — ABNORMAL LOW (ref 134–144)

## 2020-01-05 LAB — FERRITIN: Ferritin: 18 ng/mL (ref 15–150)

## 2020-01-05 LAB — IRON AND TIBC
Iron Saturation: 8 % — CL (ref 15–55)
Iron: 30 ug/dL (ref 27–159)
Total Iron Binding Capacity: 397 ug/dL (ref 250–450)
UIBC: 367 ug/dL (ref 131–425)

## 2020-01-05 MED ORDER — POLYSACCHARIDE IRON COMPLEX 150 MG PO CAPS: 150.0000 mg | ORAL_CAPSULE | Freq: Every day | ORAL | 3 refills | Status: DC

## 2020-01-05 MED ORDER — IRON (FERROUS SULFATE) 325 (65 FE) MG PO TABS: 325.0000 mg | ORAL_TABLET | ORAL | 3 refills | Status: DC

## 2020-01-05 NOTE — Assessment & Plan Note (Signed)
A: Patient states that she has been sad with feelings of emptiness and hopelessness associated with trouble sleeping, decreased appetite, fatigue, and hypervigilance since the death of her father 1.5 years ago. She denies any suicidal ideation. PHQ9 is 4, although in discussing with patient, it seems as though symptoms are impacting her daily functioning currently. She is open to medication but states that she is not interested in counseling at this time.  P: Will start sertraline 50mg  daily and reassess in 6 weeks. - Also starting bupropion for smoking cessation.

## 2020-01-05 NOTE — Assessment & Plan Note (Addendum)
A: Patient has brought all her medications to this appointment and states she takes her Januvia 100mg  daily, Glimeperide 2MG  BID, and Metformin 1000mg  BID as prescribed. She is not taking Semaglutide 7MG  daily. Prior A/P note that she has tried an SGLT2 inhibitor in the past but was unable to tolerate it due to severe vulvovaginal candidiasis. She is unable to use injectables as she is blind and without support at home. She states blood sugars have ranged from 200-300 recently. HbA1c this visit 9.8. She states that she is trying to eat healthier. Creatinine and potassium WNL this visit on ACEi.  P: Will refer patient to endocrinology given continued elevated blood sugars on 3 oral medications. - continue ACEi and statin

## 2020-01-05 NOTE — Assessment & Plan Note (Addendum)
A: Daytime fatigue is likely secondary to trouble initiating and maintaining sleep secondary to anxiety in addition to depression and disrupted circadian rhythm. She says she sleeps for prolonged periods during the day and drinks 1 energy drink daily to stay awake and has trouble sleeping at night. She was referred for a sleep study in the past but states she was unable to complete this because she is blind and required a family member to sign off on her study by mail. She has a history of IDA and was on iron supplementation in May 2021, but is not currently on iron. Iron studies today show ferritin 18, iron 30, TIBC 397, and iron saturation 8%. CBC this visit shows Hgb 11.9 with microcytosis.  P: - Will restart iron supplementation with ferrous sulfate 325mg  every other day and follow up CBC/iron studies at next visit.  - Will start sertraline 50mg  and bupropion 150mg  x 3 days, then 150mg  BID and have patient return for follow-up in 6 weeks.  -If fatigue and frequent night-time awakenings continue, she will likely need to be referred again for sleep study to r/o OSA.

## 2020-01-05 NOTE — Assessment & Plan Note (Signed)
A: In 2019, patient received an ultrasound and mammogram of the left breast and was found to have intraductal papilloma of the L breast, which was surgically excised. She denies any palpable masses, skin changes, or nipple discharge today, although she is interested in repeat mammogram.   P: Referred patient for bilateral mammogram at [facility].  - Will follow results.

## 2020-01-05 NOTE — Addendum Note (Signed)
Addended by: Mosetta Anis on: 01/05/2020 10:13 AM   Modules accepted: Orders

## 2020-01-05 NOTE — Telephone Encounter (Signed)
Called patient's mobile device using [employer] and left a voicemail per patient's request with her lab results. Informed her that her iron saturation was low, and that she will need to start taking iron supplements. I have ordered ferrous sulfate to be taken every other day to her pharmacy and she was instructed to call if she has any trouble receiving this medication. She was also informed that her HbA1c was 9.8 and reminded that I have placed a referral for her to see an endocrinologist for further DM-medication management. I informed her her calcium was low and that she may need to be started on Vitamin D supplementation at her next visit if low. Instructed to call [facility] back with any questions or concerns.   Jeralyn Bennett, PGY1 Internal Medicine Pager: [phone]

## 2020-01-05 NOTE — Assessment & Plan Note (Signed)
A: Calcium this visit low at 8.5 with history of normocalcemia. Vitamin D not checked this visit.   P: Will need repeat BMP with vitamin D levels next visit with replacement if low.

## 2020-01-05 NOTE — Progress Notes (Signed)
Internal Medicine Clinic Attending  I saw and evaluated the patient.  I personally confirmed the key portions of the history and exam documented by Dr. Speakman and I reviewed pertinent patient test results.  The assessment, diagnosis, and plan were formulated together and I agree with the documentation in the resident's note.  

## 2020-01-05 NOTE — Assessment & Plan Note (Signed)
A: Blood pressure just slightly elevated at 133/66 today on lisinopril-HCTZ 10-12.5mg  daily.  BP Readings from Last 3 Encounters:  01/04/20 (!) 133/66  10/18/19 118/66  07/12/19 121/69   P: Likely in the setting of increased stress today. Will continue to monitor on current medications.

## 2020-01-05 NOTE — Assessment & Plan Note (Signed)
A: Patient is blind and currently lives alone. She states that she has had trouble falling and staying asleep, concerned that someone will enter her home. She has been hypervigilant during the days as well, saying she is easily startled. She endorses racing thoughts.   P: Will start sertraline 50mg  daily and bupropion for smoking cessation. - Informed patient to call if she experiences prolonged worsening of her anxiety after starting these medications.

## 2020-01-12 ENCOUNTER — Other Ambulatory Visit: Payer: Self-pay | Admitting: Internal Medicine

## 2020-01-12 DIAGNOSIS — Z716 Tobacco abuse counseling: Secondary | ICD-10-CM

## 2020-01-12 DIAGNOSIS — F32A Depression, unspecified: Secondary | ICD-10-CM

## 2020-01-19 ENCOUNTER — Ambulatory Visit
Admission: RE | Admit: 2020-01-19 | Discharge: 2020-01-19 | Disposition: A | Discharge status: Home / Self Care | Payer: Medicare Other | Source: Ambulatory Visit | Attending: Internal Medicine | Admitting: Internal Medicine

## 2020-01-19 ENCOUNTER — Other Ambulatory Visit: Payer: Self-pay

## 2020-01-19 DIAGNOSIS — Z1231 Encounter for screening mammogram for malignant neoplasm of breast: Secondary | ICD-10-CM

## 2020-01-25 ENCOUNTER — Telehealth: Payer: Self-pay | Admitting: Student

## 2020-01-25 NOTE — Telephone Encounter (Signed)
Left voicemail on mobile device using [employer] stating that her mammogram showed no evidence of malignancy and that she should receive follow-up mammogram in 1 year. Instructed patient to call [phone] with any questions or concerns.  Jeralyn Bennett, PGY1  Internal Medicine Pager: [phone]

## 2020-01-27 ENCOUNTER — Other Ambulatory Visit: Payer: Self-pay | Admitting: Internal Medicine

## 2020-01-27 DIAGNOSIS — F329 Major depressive disorder, single episode, unspecified: Secondary | ICD-10-CM

## 2020-01-27 DIAGNOSIS — F419 Anxiety disorder, unspecified: Secondary | ICD-10-CM

## 2020-01-27 DIAGNOSIS — F32A Depression, unspecified: Secondary | ICD-10-CM

## 2020-01-29 ENCOUNTER — Emergency Department (HOSPITAL_COMMUNITY)
Admission: EM | Admit: 2020-01-29 | Discharge: 2020-01-29 | Disposition: A | Discharge status: Home / Self Care | Payer: Medicare Other | Attending: Emergency Medicine | Admitting: Emergency Medicine

## 2020-01-29 ENCOUNTER — Emergency Department (HOSPITAL_COMMUNITY): Payer: Medicare Other

## 2020-01-29 ENCOUNTER — Other Ambulatory Visit: Payer: Self-pay

## 2020-01-29 ENCOUNTER — Encounter (HOSPITAL_COMMUNITY): Payer: Self-pay | Admitting: Emergency Medicine

## 2020-01-29 DIAGNOSIS — R509 Fever, unspecified: Secondary | ICD-10-CM | POA: Diagnosis present

## 2020-01-29 DIAGNOSIS — I10 Essential (primary) hypertension: Secondary | ICD-10-CM | POA: Diagnosis not present

## 2020-01-29 DIAGNOSIS — Z7984 Long term (current) use of oral hypoglycemic drugs: Secondary | ICD-10-CM | POA: Diagnosis not present

## 2020-01-29 DIAGNOSIS — D3502 Benign neoplasm of left adrenal gland: Secondary | ICD-10-CM | POA: Diagnosis not present

## 2020-01-29 DIAGNOSIS — N12 Tubulo-interstitial nephritis, not specified as acute or chronic: Secondary | ICD-10-CM | POA: Diagnosis not present

## 2020-01-29 DIAGNOSIS — R519 Headache, unspecified: Secondary | ICD-10-CM | POA: Insufficient documentation

## 2020-01-29 DIAGNOSIS — R102 Pelvic and perineal pain: Secondary | ICD-10-CM | POA: Insufficient documentation

## 2020-01-29 DIAGNOSIS — I700 Atherosclerosis of aorta: Secondary | ICD-10-CM | POA: Insufficient documentation

## 2020-01-29 DIAGNOSIS — R109 Unspecified abdominal pain: Secondary | ICD-10-CM | POA: Insufficient documentation

## 2020-01-29 DIAGNOSIS — Z79899 Other long term (current) drug therapy: Secondary | ICD-10-CM | POA: Insufficient documentation

## 2020-01-29 DIAGNOSIS — K439 Ventral hernia without obstruction or gangrene: Secondary | ICD-10-CM | POA: Diagnosis not present

## 2020-01-29 DIAGNOSIS — F17210 Nicotine dependence, cigarettes, uncomplicated: Secondary | ICD-10-CM | POA: Diagnosis not present

## 2020-01-29 DIAGNOSIS — R161 Splenomegaly, not elsewhere classified: Secondary | ICD-10-CM | POA: Insufficient documentation

## 2020-01-29 DIAGNOSIS — K760 Fatty (change of) liver, not elsewhere classified: Secondary | ICD-10-CM | POA: Diagnosis not present

## 2020-01-29 DIAGNOSIS — E119 Type 2 diabetes mellitus without complications: Secondary | ICD-10-CM | POA: Insufficient documentation

## 2020-01-29 LAB — CBC
HCT: 35.2 % — ABNORMAL LOW (ref 36.0–46.0)
Hemoglobin: 10.8 g/dL — ABNORMAL LOW (ref 12.0–15.0)
MCH: 22.3 pg — ABNORMAL LOW (ref 26.0–34.0)
MCHC: 30.7 g/dL (ref 30.0–36.0)
MCV: 72.7 fL — ABNORMAL LOW (ref 80.0–100.0)
Platelets: 193 10*3/uL (ref 150–400)
RBC: 4.84 MIL/uL (ref 3.87–5.11)
RDW: 15.9 % — ABNORMAL HIGH (ref 11.5–15.5)
WBC: 6.5 10*3/uL (ref 4.0–10.5)
nRBC: 0 % (ref 0.0–0.2)

## 2020-01-29 LAB — URINALYSIS, ROUTINE W REFLEX MICROSCOPIC
Bilirubin Urine: NEGATIVE
Glucose, UA: >=500 mg/dL — AB
Ketones, ur: 20 mg/dL — AB
Nitrite: POSITIVE — AB
Protein, ur: 30 mg/dL — AB
Specific Gravity, Urine: 1.021 (ref 1.005–1.030)
WBC, UA: >50 WBC/hpf — ABNORMAL HIGH (ref 0–5)
pH: 5 (ref 5.0–8.0)

## 2020-01-29 LAB — COMPREHENSIVE METABOLIC PANEL
ALT: 40 U/L (ref 0–44)
AST: 37 U/L (ref 15–41)
Albumin: 3 g/dL — ABNORMAL LOW (ref 3.5–5.0)
Alkaline Phosphatase: 113 U/L (ref 38–126)
Anion gap: 15 (ref 5–15)
BUN: 7 mg/dL (ref 6–20)
CO2: 19 mmol/L — ABNORMAL LOW (ref 22–32)
Calcium: 8.2 mg/dL — ABNORMAL LOW (ref 8.9–10.3)
Chloride: 98 mmol/L (ref 98–111)
Creatinine, Ser: 0.69 mg/dL (ref 0.44–1.00)
GFR calc Af Amer: >60 mL/min (ref >60)
GFR calc non Af Amer: >60 mL/min (ref >60)
Glucose, Bld: 289 mg/dL — ABNORMAL HIGH (ref 70–99)
Potassium: 3.5 mmol/L (ref 3.5–5.1)
Sodium: 132 mmol/L — ABNORMAL LOW (ref 135–145)
Total Bilirubin: 0.7 mg/dL (ref 0.3–1.2)
Total Protein: 6.6 g/dL (ref 6.5–8.1)

## 2020-01-29 LAB — I-STAT BETA HCG BLOOD, ED (MC, WL, AP ONLY): I-stat hCG, quantitative: <5 m[IU]/mL (ref <5)

## 2020-01-29 LAB — LACTIC ACID, PLASMA
Lactic Acid, Venous: 4.7 mmol/L (ref 0.5–1.9)
Lactic Acid, Venous: 2.9 mmol/L (ref 0.5–1.9)

## 2020-01-29 LAB — LIPASE, BLOOD: Lipase: 38 U/L (ref 11–51)

## 2020-01-29 LAB — CBG MONITORING, ED
Glucose-Capillary: 276 mg/dL — ABNORMAL HIGH (ref 70–99)
Glucose-Capillary: 330 mg/dL — ABNORMAL HIGH (ref 70–99)

## 2020-01-29 MED ORDER — CEPHALEXIN 500 MG PO CAPS: 500.0000 mg | ORAL_CAPSULE | Freq: Four times a day (QID) | ORAL | 0 refills | Status: DC

## 2020-01-29 MED ORDER — SODIUM CHLORIDE 0.9 % IV SOLN
2.0000 g | Freq: Once | INTRAVENOUS | Status: AC
  Administered 2020-01-29: 2 g via INTRAVENOUS
  Filled 2020-01-29: qty 20

## 2020-01-29 MED ORDER — SODIUM CHLORIDE 0.9 % IV BOLUS
1000.0000 mL | Freq: Once | INTRAVENOUS | Status: AC
  Administered 2020-01-29: 1000 mL via INTRAVENOUS

## 2020-01-29 MED ORDER — MORPHINE SULFATE (PF) 4 MG/ML IV SOLN
4.0000 mg | Freq: Once | INTRAVENOUS | Status: AC
  Administered 2020-01-29: 4 mg via INTRAVENOUS
  Filled 2020-01-29: qty 1

## 2020-01-29 MED ORDER — IOHEXOL 300 MG/ML  SOLN
100.0000 mL | Freq: Once | INTRAMUSCULAR | Status: AC | PRN
  Administered 2020-01-29: 100 mL via INTRAVENOUS

## 2020-01-29 MED ORDER — METOCLOPRAMIDE HCL 5 MG/ML IJ SOLN
10.0000 mg | Freq: Once | INTRAMUSCULAR | Status: AC
  Administered 2020-01-29: 10 mg via INTRAVENOUS
  Filled 2020-01-29: qty 2

## 2020-01-29 NOTE — ED Notes (Signed)
Patient verbalizes understanding of discharge instructions. Opportunity for questioning and answers were provided. Armband removed by staff. Patient discharged from ED.  

## 2020-01-29 NOTE — Discharge Instructions (Addendum)
You were seen in the ED for nausea, vomiting, abdominal pain, changes in your urine, headache  Urine has an infection.  This has spread to your left kidney.  CT scan shows a small ventral hernia only containing fat but no obstruction or complications.  No other new or acute findings on scan.  Take antibiotics as prescribed.  Increase oral hydration until your urine is clear.  Alternate acetaminophen or ibuprofen as needed for pain, fevers or chills.  Return to the ED for sudden onset worsening constant or severe abdominal pain, persistent fevers or chills, worsening symptoms, inability to urinate.  Call your primary care doctor and make an appointment for the next 72 hours to have a recheck and ensure your symptoms are improving.

## 2020-01-29 NOTE — ED Notes (Signed)
CBG results 330

## 2020-01-29 NOTE — ED Triage Notes (Signed)
Pt to triage via [facility] from home.  Pt very anxious on arrival and yelling that she needs to use the bathroom.  Assisted to restroom by tech.  Pt blind.  Reports hx of hernia.  States she is having pain at hernia, headache, increased urination, thirst, nausea, and vomiting.  Denies diarrhea.  CBG 397 per EMS.
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
## 2020-01-29 NOTE — ED Notes (Signed)
Pt provided with drink for PO challenge

## 2020-01-31 LAB — URINE CULTURE: Culture: >=100000 — AB

## 2020-02-01 ENCOUNTER — Telehealth: Payer: Self-pay | Admitting: Emergency Medicine

## 2020-02-01 NOTE — Telephone Encounter (Signed)
Post ED Visit - Positive Culture Follow-up  Culture report reviewed by antimicrobial stewardship pharmacist: [facility] Team []  Elenor Quinones, Pharm.D. []  Heide Guile, Pharm.D., BCPS AQ-ID []  Parks Neptune, Pharm.D., BCPS [x]  Alycia Rossetti, Pharm.D., BCPS []  [place]inta, Pharm.D., BCPS, AAHIVP []  Legrand Como, Pharm.D., BCPS, AAHIVP []  Salome Arnt, PharmD, BCPS []  Johnnette Gourd, PharmD, BCPS []  Hughes Better, PharmD, BCPS []  Leeroy Cha, PharmD []  Laqueta Linden, PharmD, BCPS []  Albertina Parr, PharmD  [place] Team []  Leodis Sias, PharmD []  Lindell Spar, PharmD []  Royetta Asal, PharmD []  Graylin Shiver, Rph []  Rema Fendt) Glennon Mac, PharmD []  Arlyn Dunning, PharmD []  Netta Cedars, PharmD []  Dia Sitter, PharmD []  Leone Haven, PharmD []  Gretta Arab, PharmD []  Theodis Shove, PharmD []  Peggyann Juba, PharmD []  Reuel Boom, PharmD   Positive urine culture Treated with cephalexin, organism sensitive to the same and no further patient follow-up is required at this time.  Hazle Nordmann 02/01/2020, 10:15 AM

## 2020-02-15 ENCOUNTER — Other Ambulatory Visit: Payer: Self-pay

## 2020-02-15 ENCOUNTER — Encounter: Payer: Self-pay | Admitting: Internal Medicine

## 2020-02-15 ENCOUNTER — Telehealth: Payer: Self-pay | Admitting: Internal Medicine

## 2020-02-15 ENCOUNTER — Ambulatory Visit (INDEPENDENT_AMBULATORY_CARE_PROVIDER_SITE_OTHER): Payer: Medicare Other | Admitting: Internal Medicine

## 2020-02-15 VITALS — BP 125/66 | HR 83 | Temp 97.7°F | Ht 73.0 in | Wt 284.8 lb

## 2020-02-15 DIAGNOSIS — Z23 Encounter for immunization: Secondary | ICD-10-CM

## 2020-02-15 DIAGNOSIS — M549 Dorsalgia, unspecified: Secondary | ICD-10-CM

## 2020-02-15 DIAGNOSIS — N390 Urinary tract infection, site not specified: Secondary | ICD-10-CM

## 2020-02-15 DIAGNOSIS — M545 Low back pain: Secondary | ICD-10-CM

## 2020-02-15 NOTE — Assessment & Plan Note (Signed)
Uncomplicated UTI: She presented to the emergency department on January 29, 2020 complaining of subjective fevers, chills, urinary frequency and abdominal pain which radiated to the left lower back.  In the ED, urinalysis revealed pyuria and bacteriuria.  Urine culture grew pansensitive greater than 100,000 colonies of E. coli.  Initial lactic acid was 4.7 and spontaneously improved to 2.9.  She received IV ceftriaxone and was subsequently discharged on Keflex.  She states that she is doing well today and does not report any subsequent fevers, chills.  She has completed her course of antibiotics.  CT abdomen and pelvis did not reveal any evidence of pyelonephritis but it did show a stable 2.3 x 3.8 cm left adrenal mass.  Assessment and plan: No further actions and will encourage oral rehydration.

## 2020-02-15 NOTE — Telephone Encounter (Signed)
Letter sent.

## 2020-02-15 NOTE — Patient Instructions (Signed)
Samantha Petersen,  It was a pleasure taking care of you in the clinic today.  Overall, I am glad that you are feeling better in regards to the urinary tract infection.  For your back pain, this is likely coming from a muscle source.  I would encourage you to alternate between ibuprofen and Tylenol for pain relief.  Also, I would ask that you not sit down for a longer period of time.  Take care! Dr. Eileen Stanford  Please call the internal medicine center clinic if you have any questions or concerns, we may be able to help and keep you from a long and expensive emergency room wait. Our clinic and after hours phone number is [phone], the best time to call is Monday through Friday 9 am to 4 pm but there is always someone available 24/7 if you have an emergency. If you need medication refills please notify your pharmacy one week in advance and they will send [place] a request.

## 2020-02-15 NOTE — Assessment & Plan Note (Signed)
Musculoskeletal pain: She complains of a 2-week history of left lower back pain.  She states that the pain is intermittent and usually occurs after she sits down for a longer period of time.  She does not report of any radiation of her pain to her lower extremities.  She states that she works for [employer] and usually has to be seated for longer.  She states that her pain is relieved with ibuprofen and does not alter her level of function throughout the day.  On physical exams, there is mild tenderness at the left lower back.  Straight leg test was negative bilaterally.  Plan: -Conservative management with as needed ibuprofen and Tylenol.  We will also encourage frequent upstanding and avoid longer periods of sitting.
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
## 2020-02-16 NOTE — Progress Notes (Signed)
Internal Medicine Clinic Attending  Case discussed with Dr. Agyei  At the time of the visit.  We reviewed the resident's history and exam and pertinent patient test results.  I agree with the assessment, diagnosis, and plan of care documented in the resident's note.  

## 2020-02-25 ENCOUNTER — Encounter: Payer: Self-pay | Admitting: Internal Medicine

## 2020-02-28 ENCOUNTER — Encounter: Payer: Self-pay | Admitting: Internal Medicine

## 2020-02-28 ENCOUNTER — Other Ambulatory Visit: Payer: Self-pay

## 2020-02-28 ENCOUNTER — Ambulatory Visit: Payer: Medicare Other | Admitting: Internal Medicine

## 2020-02-28 VITALS — BP 130/70 | HR 67 | Ht 73.0 in | Wt 283.0 lb

## 2020-02-28 DIAGNOSIS — E278 Other specified disorders of adrenal gland: Secondary | ICD-10-CM | POA: Diagnosis not present

## 2020-02-28 DIAGNOSIS — E119 Type 2 diabetes mellitus without complications: Secondary | ICD-10-CM

## 2020-02-28 LAB — POCT GLYCOSYLATED HEMOGLOBIN (HGB A1C): Hemoglobin A1C: 9.8 % — AB (ref 4.0–5.6)

## 2020-02-28 MED ORDER — TRULICITY 0.75 MG/0.5ML ~~LOC~~ SOAJ: SUBCUTANEOUS | 1 refills | Status: DC
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
## 2020-02-28 NOTE — Patient Instructions (Addendum)
STOP juice and regular sodas!  STOP fried foods.  Please continue: - Metformin ER 1000 mg 2x a day - Glimepiride 4 mg before b'fast  Try to add: - Trulicity 6.56 mg weekly on Sundays am  Please start checking blood sugars.  Please return in 1 month with your sugar log.   PATIENT INSTRUCTIONS FOR TYPE 2 DIABETES:  **Please join MyChart!** - see attached instructions about how to join if you have not done so already.  DIET AND EXERCISE Diet and exercise is an important part of diabetic treatment.  We recommended aerobic exercise in the form of brisk walking (working between 40-60% of maximal aerobic capacity, similar to brisk walking) for 150 minutes per week (such as 30 minutes five days per week) along with 3 times per week performing 'resistance' training (using various gauge rubber tubes with handles) 5-10 exercises involving the major muscle groups (upper body, lower body and core) performing 10-15 repetitions (or near fatigue) each exercise. Start at half the above goal but build slowly to reach the above goals. If limited by weight, joint pain, or disability, we recommend daily walking in a swimming pool with water up to waist to reduce pressure from joints while allow for adequate exercise.    BLOOD GLUCOSES Monitoring your blood glucoses is important for continued management of your diabetes. Please check your blood glucoses 2-4 times a day: fasting, before meals and at bedtime (you can rotate these measurements - e.g. one day check before the 3 meals, the next day check before 2 of the meals and before bedtime, etc.).   HYPOGLYCEMIA (low blood sugar) Hypoglycemia is usually a reaction to not eating, exercising, or taking too much insulin/ other diabetes drugs.  Symptoms include tremors, sweating, hunger, confusion, headache, etc. Treat IMMEDIATELY with 15 grams of Carbs: . 4 glucose tablets .  cup regular juice/soda . 2 tablespoons raisins . 4 teaspoons sugar . 1 tablespoon  honey Recheck blood glucose in 15 mins and repeat above if still symptomatic/blood glucose <100.  RECOMMENDATIONS TO REDUCE YOUR RISK OF DIABETIC COMPLICATIONS: * Take your prescribed MEDICATION(S) * Follow a DIABETIC diet: Complex carbs, fiber rich foods, (monounsaturated and polyunsaturated) fats * AVOID saturated/trans fats, high fat foods, >2,300 mg salt per day. * EXERCISE at least 5 times a week for 30 minutes or preferably daily.  * DO NOT SMOKE OR DRINK more than 1 drink a day. * Check your FEET every day. Do not wear tightfitting shoes. Contact us if you develop an ulcer * See your EYE doctor once a year or more if needed * Get a FLU shot once a year * Get a PNEUMONIA vaccine once before and once after age 42 years  GOALS:  * Your Hemoglobin A1c of <7%  * fasting sugars need to be <130 * after meals sugars need to be <180 (2h after you start eating) * Your Systolic BP should be 812 or lower  * Your Diastolic BP should be 80 or lower  * Your HDL (Good Cholesterol) should be 40 or higher  * Your LDL (Bad Cholesterol) should be 100 or lower. * Your Triglycerides should be 150 or lower  * Your Urine microalbumin (kidney function) should be <30 * Your Body Mass Index should be 25 or lower    Please consider the following ways to cut down carbs and fat and increase fiber and micronutrients in your diet: - substitute whole grain for white bread or pasta - substitute brown rice for white  rice - substitute 90-calorie flat bread pieces for slices of bread when possible - substitute sweet potatoes or yams for white potatoes - substitute humus for margarine - substitute tofu for cheese when possible - substitute almond or rice milk for regular milk (would not drink soy milk daily due to concern for soy estrogen influence on breast cancer risk) - substitute dark chocolate for other sweets when possible - substitute water - can add lemon or orange slices for taste - for diet sodas  (artificial sweeteners will trick your body that you can eat sweets without getting calories and will lead you to overeating and weight gain in the long run) - do not skip breakfast or other meals (this will slow down the metabolism and will result in more weight gain over time)  - can try smoothies made from fruit and almond/rice milk in am instead of regular breakfast - can also try old-fashioned (not instant) oatmeal made with almond/rice milk in am - order the dressing on the side when eating salad at a restaurant (pour less than half of the dressing on the salad) - eat as little meat as possible - can try juicing, but should not forget that juicing will get rid of the fiber, so would alternate with eating raw veg./fruits or drinking smoothies - use as little oil as possible, even when using olive oil - can dress a salad with a mix of balsamic vinegar and lemon juice, for e.g. - use agave nectar, stevia sugar, or regular sugar rather than artificial sweateners - steam or broil/roast veggies  - snack on veggies/fruit/nuts (unsalted, preferably) when possible, rather than processed foods - reduce or eliminate aspartame in diet (it is in diet sodas, chewing gum, etc) Read the labels!  Try to read Dr. Janene Harvey book: "Program for Reversing Diabetes" for other ideas for healthy eating.

## 2020-03-17 ENCOUNTER — Encounter: Payer: Self-pay | Admitting: *Deleted

## 2020-03-17 NOTE — Progress Notes (Unsigned)

## 2020-03-20 ENCOUNTER — Other Ambulatory Visit: Payer: Self-pay | Admitting: Student

## 2020-03-20 DIAGNOSIS — E118 Type 2 diabetes mellitus with unspecified complications: Secondary | ICD-10-CM

## 2020-03-20 MED ORDER — SITAGLIPTIN PHOSPHATE 100 MG PO TABS: ORAL_TABLET | ORAL | 3 refills | Status: DC

## 2020-03-20 NOTE — Telephone Encounter (Signed)
Need refill on JANUVIA 100 MG tablet ;pt contact [phone]  [id number] - [place], [place]

## 2020-03-21 NOTE — Progress Notes (Signed)
Things That May Be Affecting Your Health:  Alcohol  Hearing loss  Pain   X Depression  Home Safety  Sexual Health  X Diabetes  Lack of physical activity  Stress   Difficulty with daily activities  Loneliness  Tiredness   Drug use  Medicines  Tobacco use   Falls  Motor Vehicle Safety  Weight  X Food choices  Oral Health  Other    YOUR PERSONALIZED HEALTH PLAN : 1. Schedule your next subsequent Medicare Wellness visit in one year 2. Attend all of your regular appointments to address your medical issues 3. Complete the preventative screenings and services   Annual Wellness Visit   Medicare Covered Preventative Screenings and [place] Men and Women Who How Often Need? Date of Last Service Action  Abdominal Aortic Aneurysm Adults with AAA risk factors Once     Alcohol Misuse and Counseling All Adults Screening once a year if no alcohol misuse. Counseling up to 4 face to face sessions.     Bone Density Measurement  Adults at risk for osteoporosis Once every 2 yrs     Lipid Panel Z13.6 All adults without CV disease Once every 5 yrs     Colorectal Cancer   Stool sample or  Colonoscopy All adults 74 and older   Once every year  Every 10 years     Depression All Adults Once a year Yes Today   Diabetes Screening Blood glucose, post glucose load, or GTT Z13.1  All adults at risk  Pre-diabetics  Once per year  Twice per year     Diabetes  Self-Management Training All adults Diabetics 10 hrs first year; 2 hours subsequent years. Requires Copay Yes    Glaucoma  Diabetics  Family history of glaucoma  African Americans 66 yrs +  Hispanic Americans 41 yrs + Annually - requires coppay     Hepatitis C Z72.89 or F19.20  High Risk for HCV  Born between 1945 and 1965  Annually  Once     HIV Z11.4 All adults based on risk  Annually btw ages 53 & 32 regardless of risk  Annually > 65 yrs if at increased risk     Lung Cancer Screening Asymptomatic adults  aged 37-77 with 30 pack yr history and current smoker OR quit within the last 15 yrs Annually Must have counseling and shared decision making documentation before first screen     Medical Nutrition Therapy Adults with   Diabetes  Renal disease  Kidney transplant within past 3 yrs 3 hours first year; 2 hours subsequent years Yes    Obesity and Counseling All adults Screening once a year Counseling if BMI 30 or higher  Today   Tobacco Use Counseling Adults who use tobacco  Up to 8 visits in one year     Vaccines Z23  Hepatitis B  Influenza   Pneumonia  Adults   Once  Once every flu season  Two different vaccines separated by one year     Next Annual Wellness Visit People with Medicare Every year  Today     Services & Screenings Women Who How Often Need  Date of Last Service Action  Mammogram  Z12.31 Women over 19 One baseline ages 7-39. Annually ager 49 yrs+ No 01/21/20   Pap tests All women Annually if high risk. Every 2 yrs for normal risk women No 12/15/17   Screening for cervical cancer with   Pap (Z01.419 nl or Z01.411abnl) &  HPV Z11.51 Women aged 77 to 67 Once every 5 yrs     Screening pelvic and breast exams All women Annually if high risk. Every 2 yrs for normal risk women     Sexually Transmitted Diseases  Chlamydia  Gonorrhea  Syphilis All at risk adults Annually for non pregnant females at increased risk         [place] Men Who How Ofter Need  Date of Last Service Action  Prostate Cancer - DRE & PSA Men over 50 Annually.  DRE might require a copay.     Sexually Transmitted Diseases  Syphilis All at risk adults Annually for men at increased risk      Jeralyn Bennett, MD 03/21/2020, 4:44 PM Pager: [phone]

## 2020-04-14 ENCOUNTER — Encounter: Payer: Self-pay | Admitting: Internal Medicine

## 2020-04-14 ENCOUNTER — Ambulatory Visit: Payer: Medicare Other | Admitting: Internal Medicine

## 2020-04-14 ENCOUNTER — Other Ambulatory Visit: Payer: Self-pay

## 2020-04-14 VITALS — BP 138/72 | HR 89 | Ht 73.0 in | Wt 273.8 lb

## 2020-04-14 DIAGNOSIS — E782 Mixed hyperlipidemia: Secondary | ICD-10-CM | POA: Diagnosis not present

## 2020-04-14 DIAGNOSIS — E278 Other specified disorders of adrenal gland: Secondary | ICD-10-CM | POA: Diagnosis not present

## 2020-04-14 DIAGNOSIS — E119 Type 2 diabetes mellitus without complications: Secondary | ICD-10-CM

## 2020-04-14 MED ORDER — TRULICITY 1.5 MG/0.5ML ~~LOC~~ SOAJ: 1.5000 mg | SUBCUTANEOUS | 1 refills | Status: DC

## 2020-04-14 MED ORDER — DEXAMETHASONE 1 MG PO TABS: ORAL_TABLET | ORAL | 0 refills | Status: DC
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
## 2020-04-14 NOTE — Patient Instructions (Addendum)
Please stop at the lab.  Please return for a cortisol level between 8 and 9 AM, fasting, after taking dexamethasone at 10-11 PM the night before.  Please continue: - Metformin ER 1000 mg 2x a day - Glimepiride 4 mg before b'fast  Please increase: - Trulicity 1.5 mg weekly on Sundays am  Please return in 3 months.

## 2020-04-18 ENCOUNTER — Other Ambulatory Visit (INDEPENDENT_AMBULATORY_CARE_PROVIDER_SITE_OTHER): Payer: Medicare Other

## 2020-04-18 ENCOUNTER — Encounter: Payer: Self-pay | Admitting: Endocrinology

## 2020-04-18 DIAGNOSIS — E278 Other specified disorders of adrenal gland: Secondary | ICD-10-CM | POA: Diagnosis not present

## 2020-04-20 ENCOUNTER — Telehealth: Payer: Self-pay | Admitting: Internal Medicine

## 2020-04-20 ENCOUNTER — Other Ambulatory Visit: Payer: Self-pay

## 2020-04-20 ENCOUNTER — Other Ambulatory Visit: Payer: Self-pay | Admitting: Internal Medicine

## 2020-04-20 DIAGNOSIS — E119 Type 2 diabetes mellitus without complications: Secondary | ICD-10-CM

## 2020-04-20 MED ORDER — METFORMIN HCL ER 500 MG PO TB24: ORAL_TABLET | ORAL | 3 refills | Status: DC

## 2020-04-20 MED ORDER — ATORVASTATIN CALCIUM 20 MG PO TABS
ORAL_TABLET | ORAL | 3 refills | Status: DC
Start: 2020-04-20 — End: 2020-07-20

## 2020-04-20 NOTE — Telephone Encounter (Signed)
Metformin #360 with 3 refills sent to [facility] on 10/25/2019. Confirmed with patient she does not use this pharmacy. [facility]ing [facility]. L. Anjolie Majer, BSN, RN-BC

## 2020-04-20 NOTE — Telephone Encounter (Signed)
Patient to be called at ph# [phone] re: Patient is confused about the RX for Trulicity that was sent to [facility] on 04/14/20 (Patient states Dr. Cruzita Lederer told patient 04/14/20 that patient does not need Trulicity anymore)

## 2020-04-20 NOTE — Telephone Encounter (Signed)
Need refill on  metFORMIN (GLUCOPHAGE-XR) 500 MG 24 hr tablet;pt contact [phone]  [id number] [place], [place] - [address]   Pt is almost out of medicine

## 2020-04-20 NOTE — Telephone Encounter (Signed)
I explained the dose change at the time of the visit, and also  per the last AVS:   Please increase: - Trulicity 1.5 mg weekly on Sundays am  I sent a prescription for the new Trulicity dose to her pharmacy.  She needs to start this.

## 2020-04-20 NOTE — Telephone Encounter (Signed)
Called patient, advised of message below via interpreter.  Patient verbalized understanding.

## 2020-04-20 NOTE — Telephone Encounter (Signed)
Please advise? I seen your last note- it says for her to increase the Trulicity, not to stop it. Just wanted to make sure I was correct. Thanks!

## 2020-04-27 LAB — CORTISOL-AM, BLOOD: Cortisol - AM: 1 ug/dL — ABNORMAL LOW

## 2020-04-27 LAB — DEXAMETHASONE, BLOOD: Dexamethasone, Serum: 270 ng/dL

## 2020-05-01 ENCOUNTER — Other Ambulatory Visit: Payer: Self-pay | Admitting: Internal Medicine

## 2020-05-16 ENCOUNTER — Encounter: Payer: Medicare Other | Admitting: Internal Medicine

## 2020-06-01 ENCOUNTER — Other Ambulatory Visit: Payer: Self-pay | Admitting: Internal Medicine

## 2020-06-01 DIAGNOSIS — I10 Essential (primary) hypertension: Secondary | ICD-10-CM

## 2020-07-12 ENCOUNTER — Encounter: Payer: Medicare Other | Admitting: Internal Medicine

## 2020-07-18 ENCOUNTER — Other Ambulatory Visit: Payer: Self-pay

## 2020-07-18 ENCOUNTER — Encounter: Payer: Self-pay | Admitting: Internal Medicine

## 2020-07-18 ENCOUNTER — Ambulatory Visit (INDEPENDENT_AMBULATORY_CARE_PROVIDER_SITE_OTHER): Payer: Medicare Other | Admitting: Internal Medicine

## 2020-07-18 VITALS — BP 139/79 | HR 89 | Temp 97.6°F | Ht 73.0 in | Wt 276.2 lb

## 2020-07-18 DIAGNOSIS — F325 Major depressive disorder, single episode, in full remission: Secondary | ICD-10-CM | POA: Diagnosis not present

## 2020-07-18 DIAGNOSIS — D509 Iron deficiency anemia, unspecified: Secondary | ICD-10-CM | POA: Diagnosis not present

## 2020-07-18 DIAGNOSIS — E119 Type 2 diabetes mellitus without complications: Secondary | ICD-10-CM | POA: Diagnosis not present

## 2020-07-18 DIAGNOSIS — I10 Essential (primary) hypertension: Secondary | ICD-10-CM

## 2020-07-18 DIAGNOSIS — L821 Other seborrheic keratosis: Secondary | ICD-10-CM

## 2020-07-18 LAB — POCT GLYCOSYLATED HEMOGLOBIN (HGB A1C): Hemoglobin A1C: 7.4 % — AB (ref 4.0–5.6)

## 2020-07-18 LAB — GLUCOSE, CAPILLARY: Glucose-Capillary: 143 mg/dL — ABNORMAL HIGH (ref 70–99)

## 2020-07-18 NOTE — Assessment & Plan Note (Signed)
Samantha Petersen states that she continues to take her sertraline daily and feels it has helped her depression significantly.  She denies any current feelings of depression at this time and feels she is doing well overall.  Assessment/plan: -Refilled sertraline 50 mg daily

## 2020-07-18 NOTE — Assessment & Plan Note (Addendum)
Samantha Petersen states that the lesion on her right breast continues to bother her and she has become self-conscious as her boyfriend told her he did not like it.  She is unsure if it has gotten larger.  She would like to have it removed at this time.  Assessment/plan: Given that lesion has some discoloration and patient would like it removed, will place referral to dermatology at this time.  -Referral to dermatology
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
## 2020-07-18 NOTE — Patient Instructions (Addendum)
It was nice seeing you today! Thank you for choosing [facility] for your Primary Care.    Today we talked about:   1. Diabetes:  a. Don't forget to follow up with Dr. Cruzita Lederer in 2 days.  b. STOP taking Januvia c. CONTINUE Metformin (1000 mg twice daily), Trulicity and Glimepiride   2. Hypertension: Your blood pressure is great today. No medication changes made.   3. Cholesterol: I will call you with your cholesterol results. Dr. Cruzita Lederer will likely also talk to you about these results.   4. Anemia: I will call you with your blood count results. For now, continue taking the Iron Supplement.   5. Mole on your Right Breast: I have sent a referral to dermatology for removal. Our clinic will call you to schedule this.   ------------------------------------------ Fue agradable verte hoy! Clayburn Pert por elegir [facility] para su A[facility] Primaria.   Hoy hablamos de:  Diabetes: No olvide hacer un seguimiento con el Dr. Cruzita Lederer en 2 das. DEJAR de tomar Januvia CONTINUAR Metformina (1000 mg [place]), Trulicity y [place]  Hipertensin: Su presin arterial es excelente hoy. No se realizaron cambios de [employer].  Colesterol: Lo llamar con sus resultados de colesterol. Es probable que el Dr. Cruzita Lederer tambin le hable sobre [place].  Anemia: Te llamar con los resultados de tu hemograma. Por ahora, contine tomando el suplemento de hierro.  Lunar en el seno derecho: He enviado una referencia a dermatologa para que lo eliminen. Nuestra clnica le llamar para programar esto.

## 2020-07-18 NOTE — Assessment & Plan Note (Signed)
BP: 139/79   Current medication regimen includes lisinopril-HCTZ.  Patient states that she takes this daily without any difficulty.  She denies any chest pain with exertion or at rest, shortness of breath, headaches, dizziness.  Overall she states she feels great.  Assessment/plan: Blood pressure marginally more elevated today compared to prior visits.  No medication adjustment at this time  -Refilled lisinopril-HCTZ

## 2020-07-18 NOTE — Assessment & Plan Note (Addendum)
Ms. Garrow states that she has been following up with Dr. Cruzita Lederer and has an upcoming appointment.  She is requesting that her A1c be evaluated today, as well as her lipid panel.   She notes that she was started on Trulicity and she really loves this medication as it has given her more energy, helped her lose weight, and even helped her slow down on her tobacco use.  Ms. Padgett brought in her pill bottles with her today which include glimepiride, Metformin, and Januvia.  I discussed with her that Januvia was not on her medication list and she was unaware of this. She did not bring her glucometer with her today. Ms. Roads also notes that she stopped taking atorvastatin several months ago she was told she no longer needed it.  Assessment/plan: A1c significantly improved today from 9.8% to 7.6%.  She has also had some weight loss documented.  Patient has follow-up with her endocrinologist on the 10th so I will defer major medication adjustments.  However I am concerned that patient is taking both DPP 4 inhibitor as well as a GLP-1.  I instructed her to discontinue her Januvia today given that she has had such great response to Trulicity.  I also addressed the importance of her being on a statin.  Ms. Gravett did have some concern over pill burden, so we will obtain a lipid panel first and go from there.  -Follow-up with endocrinology 2 days -Continue Trulicity, Metformin, glimepiride -Lipid panel pending  ADDENDUM:  Lipid panel with LDL above goal. This was discussed at her appointment with Endocrinology and she was restarted on Lipitor 20 mg. Will continue to monitor

## 2020-07-18 NOTE — Assessment & Plan Note (Signed)
Samantha Petersen is requesting that her anemia is rechecked today.  She denies any symptoms consistent with palpitations or shortness of breath, however she is quite concerned with having to take daily iron supplements and would like to know if it is okay to discontinue.  She notes that the iron polysaccharide capsules are not covered by her insurance.  She notes that her sources of bleeding are still hemorrhoids and dysmenorrhea.  She notes that when her menstrual cycle occurs, and it does so only every to 3 months, it is quite heavy with large clots.  She has not followed up with OB/GYN.  Assessment/plan: No evidence of anemia on examination, however CBC was obtained.  I discussed with Samantha Petersen that as long as she has blood loss, iron supplementation is recommended.  She expressed understanding.  -Continue iron polysaccharide capsules -CBC pending

## 2020-07-19 LAB — CBC WITH DIFFERENTIAL/PLATELET
Basophils Absolute: 0.1 10*3/uL (ref 0.0–0.2)
Basos: 1 %
EOS (ABSOLUTE): 0.2 10*3/uL (ref 0.0–0.4)
Eos: 1 %
Hematocrit: 36 % (ref 34.0–46.6)
Hemoglobin: 11.3 g/dL (ref 11.1–15.9)
Immature Grans (Abs): 0.1 10*3/uL (ref 0.0–0.1)
Immature Granulocytes: 1 %
Lymphocytes Absolute: 3.7 10*3/uL — ABNORMAL HIGH (ref 0.7–3.1)
Lymphs: 31 %
MCH: 22.3 pg — ABNORMAL LOW (ref 26.6–33.0)
MCHC: 31.4 g/dL — ABNORMAL LOW (ref 31.5–35.7)
MCV: 71 fL — ABNORMAL LOW (ref 79–97)
Monocytes Absolute: 0.6 10*3/uL (ref 0.1–0.9)
Monocytes: 5 %
Neutrophils Absolute: 7.1 10*3/uL — ABNORMAL HIGH (ref 1.4–7.0)
Neutrophils: 61 %
Platelets: 332 10*3/uL (ref 150–450)
RBC: 5.07 x10E6/uL (ref 3.77–5.28)
RDW: 17 % — ABNORMAL HIGH (ref 11.7–15.4)
WBC: 11.8 10*3/uL — ABNORMAL HIGH (ref 3.4–10.8)

## 2020-07-19 LAB — LIPID PANEL
Chol/HDL Ratio: 5.5 ratio — ABNORMAL HIGH (ref 0.0–4.4)
Cholesterol, Total: 164 mg/dL (ref 100–199)
HDL: 30 mg/dL — ABNORMAL LOW (ref >39)
LDL Chol Calc (NIH): 117 mg/dL — ABNORMAL HIGH (ref 0–99)
Triglycerides: 93 mg/dL (ref 0–149)
VLDL Cholesterol Cal: 17 mg/dL (ref 5–40)

## 2020-07-20 ENCOUNTER — Encounter: Payer: Self-pay | Admitting: Internal Medicine

## 2020-07-20 ENCOUNTER — Other Ambulatory Visit: Payer: Self-pay

## 2020-07-20 ENCOUNTER — Ambulatory Visit: Payer: Medicare Other | Admitting: Internal Medicine

## 2020-07-20 VITALS — BP 140/88 | HR 88 | Ht 73.0 in | Wt 273.0 lb

## 2020-07-20 DIAGNOSIS — E782 Mixed hyperlipidemia: Secondary | ICD-10-CM | POA: Diagnosis not present

## 2020-07-20 DIAGNOSIS — E119 Type 2 diabetes mellitus without complications: Secondary | ICD-10-CM | POA: Diagnosis not present

## 2020-07-20 DIAGNOSIS — E278 Other specified disorders of adrenal gland: Secondary | ICD-10-CM | POA: Diagnosis not present

## 2020-07-20 MED ORDER — ATORVASTATIN CALCIUM 20 MG PO TABS: ORAL_TABLET | ORAL | 3 refills | Status: DC

## 2020-07-20 NOTE — Patient Instructions (Addendum)
Please continue: - Metformin ER 1000 mg 2x a day - Glimepiride 4 mg before b'fast - Trulicity 1.5 mg weekly on Sundays am  Please stop at the lab.  Please restart: - Lipitor 20 mg daily  Please return in 3 months.
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
## 2020-07-21 NOTE — Progress Notes (Signed)
Hemoglobin improved some from last visit 5 months ago.  She continues to have a low MCV consistent with iron deficiency though.  Given that patient continues to have blood loss due to dysmenorrhea, she should continue iron supplementation.  Attempted to call patient x1 and was unable to reach.  Voice message left.

## 2020-07-21 NOTE — Progress Notes (Signed)
Internal Medicine Clinic Attending  Case discussed with Dr. Basaraba  At the time of the visit.  We reviewed the resident's history and exam and pertinent patient test results.  I agree with the assessment, diagnosis, and plan of care documented in the resident's note.  

## 2020-07-24 ENCOUNTER — Telehealth: Payer: Self-pay

## 2020-07-24 NOTE — Telephone Encounter (Signed)
Had labs on 07/18/20, Dr. Charleen Kirks left a VM r/t results. SChaplin, RN,BSN

## 2020-07-24 NOTE — Telephone Encounter (Signed)
Pls contact pt [phone] missed call on Friday

## 2020-07-25 NOTE — Telephone Encounter (Signed)
Samantha Petersen, is she wanting [place] to call her back or was Dr. Noni Saupe voicemail enough information? Thanks!  Dr. Raliegh Ip.

## 2020-07-26 NOTE — Telephone Encounter (Signed)
Attempted to return call to Samantha Petersen regarding her lab result with assistance of telephone interpreter. Patient did not pick up. Voicemail left with callback number.

## 2020-07-26 NOTE — Telephone Encounter (Signed)
She wants a return call about her results.  I am not sure if Dr. B left the results on her VM, sounds like she just left a message that she was calling about her results. Thanks! Damarion Mendizabal

## 2020-07-27 ENCOUNTER — Telehealth: Payer: Self-pay | Admitting: *Deleted

## 2020-07-27 DIAGNOSIS — E119 Type 2 diabetes mellitus without complications: Secondary | ICD-10-CM

## 2020-07-27 MED ORDER — GLIPIZIDE ER 5 MG PO TB24: 5.0000 mg | ORAL_TABLET | Freq: Every day | ORAL | 3 refills | Status: DC

## 2020-07-27 NOTE — Telephone Encounter (Signed)
Fax from [facility] stating Glimepiride is no longer on formulary - Please send alternative rx.  Thanks

## 2020-07-29 LAB — METANEPHRINES, PLASMA
Metanephrine, Free: <25 pg/mL (ref <=57)
Normetanephrine, Free: 98 pg/mL (ref <=148)
Total Metanephrines-Plasma: 98 pg/mL (ref <=205)

## 2020-07-29 LAB — ALDOSTERONE + RENIN ACTIVITY W/ RATIO: Aldosterone: 7 ng/dL

## 2020-07-29 LAB — CATECHOLAMINES, FRACTIONATED, PLASMA
Dopamine: <10 pg/mL
Epinephrine: 44 pg/mL
Norepinephrine: 433 pg/mL
Total Catecholamines: 477 pg/mL

## 2020-07-29 LAB — GLUCOSE, FASTING: Glucose, Bld: 85 mg/dL (ref 65–99)

## 2020-08-01 ENCOUNTER — Telehealth: Payer: Self-pay | Admitting: Internal Medicine

## 2020-08-01 NOTE — Telephone Encounter (Signed)
Called pt via interpreter and advised pt of results.

## 2020-08-01 NOTE — Telephone Encounter (Signed)
Pt called stating she thinks she received a call from [place] regarding her labs but was not sure because she could not understand. She only understood a few works and asks if she could get a call back with those results with an interpreter at her cell phone # [phone].

## 2020-08-17 ENCOUNTER — Other Ambulatory Visit: Payer: Self-pay | Admitting: *Deleted

## 2020-08-17 MED ORDER — PRODIGY TWIST TOP LANCETS 28G MISC: 2 refills | Status: DC

## 2020-08-17 MED ORDER — PRODIGY NO CODING BLOOD GLUC VI STRP: ORAL_STRIP | 11 refills | Status: DC

## 2020-10-06 NOTE — Addendum Note (Signed)
Addended by: Yvonna Alanis E on: 10/06/2020 11:09 AM   Modules accepted: Orders

## 2020-10-17 ENCOUNTER — Ambulatory Visit: Payer: Medicare Other | Admitting: Internal Medicine

## 2020-10-17 ENCOUNTER — Other Ambulatory Visit: Payer: Self-pay

## 2020-10-17 ENCOUNTER — Encounter: Payer: Self-pay | Admitting: Internal Medicine

## 2020-10-17 VITALS — BP 120/78 | HR 92 | Ht 73.0 in | Wt 282.2 lb

## 2020-10-17 DIAGNOSIS — E119 Type 2 diabetes mellitus without complications: Secondary | ICD-10-CM

## 2020-10-17 DIAGNOSIS — E278 Other specified disorders of adrenal gland: Secondary | ICD-10-CM

## 2020-10-17 DIAGNOSIS — E782 Mixed hyperlipidemia: Secondary | ICD-10-CM

## 2020-10-17 LAB — POCT GLYCOSYLATED HEMOGLOBIN (HGB A1C): Hemoglobin A1C: 9 % — AB (ref 4.0–5.6)

## 2020-10-17 MED ORDER — GLIPIZIDE ER 5 MG PO TB24: 10.0000 mg | ORAL_TABLET | Freq: Every day | ORAL | 3 refills | Status: DC

## 2020-10-17 MED ORDER — TRULICITY 3 MG/0.5ML ~~LOC~~ SOAJ: 3.0000 mg | SUBCUTANEOUS | 3 refills | Status: DC

## 2020-10-17 NOTE — Patient Instructions (Addendum)
Please continue: - Metformin ER 1000 mg 2x a day  Please increase: - Glipizide XL 10 mg before breakfast - Trulicity 3 mg weekly on Sundays am  Please continue: - Lipitor 20 mg daily  STOP MILK.  STOP ICECREAM.  STOP BAGELS. STOP FRIED FOODS. NO JUICE.  Please return in 3-4 months.
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
## 2020-10-23 ENCOUNTER — Other Ambulatory Visit: Payer: Self-pay | Admitting: Internal Medicine

## 2020-10-23 DIAGNOSIS — F32A Depression, unspecified: Secondary | ICD-10-CM

## 2020-10-23 DIAGNOSIS — F419 Anxiety disorder, unspecified: Secondary | ICD-10-CM

## 2020-10-24 ENCOUNTER — Other Ambulatory Visit: Payer: Self-pay | Admitting: Internal Medicine

## 2020-10-24 DIAGNOSIS — E119 Type 2 diabetes mellitus without complications: Secondary | ICD-10-CM

## 2020-12-04 ENCOUNTER — Other Ambulatory Visit: Payer: Self-pay | Admitting: Internal Medicine

## 2020-12-04 DIAGNOSIS — I10 Essential (primary) hypertension: Secondary | ICD-10-CM

## 2020-12-11 ENCOUNTER — Other Ambulatory Visit: Payer: Self-pay | Admitting: Internal Medicine

## 2020-12-11 DIAGNOSIS — I10 Essential (primary) hypertension: Secondary | ICD-10-CM

## 2020-12-11 DIAGNOSIS — D509 Iron deficiency anemia, unspecified: Secondary | ICD-10-CM

## 2020-12-13 NOTE — Telephone Encounter (Signed)
CMA denied request in error. Refill request sent in separate encounter.Despina Hidden Cassady7/6/20224:14 PM

## 2021-02-22 ENCOUNTER — Ambulatory Visit: Payer: Medicare Other | Admitting: Internal Medicine

## 2021-03-02 ENCOUNTER — Encounter: Payer: Self-pay | Admitting: Internal Medicine

## 2021-03-02 ENCOUNTER — Other Ambulatory Visit: Payer: Self-pay

## 2021-03-02 ENCOUNTER — Ambulatory Visit (INDEPENDENT_AMBULATORY_CARE_PROVIDER_SITE_OTHER): Payer: Medicare Other | Admitting: Internal Medicine

## 2021-03-02 VITALS — BP 128/82 | HR 82 | Ht 73.0 in | Wt 288.2 lb

## 2021-03-02 DIAGNOSIS — E119 Type 2 diabetes mellitus without complications: Secondary | ICD-10-CM | POA: Diagnosis not present

## 2021-03-02 DIAGNOSIS — E782 Mixed hyperlipidemia: Secondary | ICD-10-CM | POA: Diagnosis not present

## 2021-03-02 DIAGNOSIS — E278 Other specified disorders of adrenal gland: Secondary | ICD-10-CM | POA: Diagnosis not present

## 2021-03-02 LAB — POCT GLYCOSYLATED HEMOGLOBIN (HGB A1C): Hemoglobin A1C: 7.6 % — AB (ref 4.0–5.6)
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
## 2021-03-02 NOTE — Patient Instructions (Signed)
Please continue: - Metformin ER 1000 mg 2x a day - Glipizide XL 10 mg before breakfast - Trulicity 3 mg weekly on Sundays am  For larger or later dinners, take: - Glipizide XL 5 mg  - crushed - 15-30 min before that meal  Please continue: - Lipitor 20 mg daily  Please return in 3-4 months.

## 2021-04-06 ENCOUNTER — Other Ambulatory Visit: Payer: Self-pay | Admitting: Internal Medicine

## 2021-04-06 DIAGNOSIS — E119 Type 2 diabetes mellitus without complications: Secondary | ICD-10-CM

## 2021-04-10 ENCOUNTER — Other Ambulatory Visit: Payer: Self-pay

## 2021-04-10 ENCOUNTER — Telehealth: Payer: Self-pay | Admitting: Internal Medicine

## 2021-04-10 DIAGNOSIS — E119 Type 2 diabetes mellitus without complications: Secondary | ICD-10-CM

## 2021-04-10 MED ORDER — TRULICITY 3 MG/0.5ML ~~LOC~~ SOAJ: 3.0000 mg | SUBCUTANEOUS | 3 refills | Status: DC

## 2021-04-10 NOTE — Telephone Encounter (Signed)
Pt calling in regards to a refill of Dulaglutide (TRULICITY) 3 YJ/4.9QJ SOPN. Has none left.  [id number] [address], [address], [place] [address]   Pt contact [phone]

## 2021-04-10 NOTE — Telephone Encounter (Signed)
Patient's medication have now been sent in

## 2021-04-12 ENCOUNTER — Other Ambulatory Visit: Payer: Self-pay | Admitting: Student

## 2021-05-30 ENCOUNTER — Other Ambulatory Visit: Payer: Self-pay | Admitting: Student

## 2021-06-01 ENCOUNTER — Ambulatory Visit: Payer: Medicare Other | Admitting: Internal Medicine

## 2021-06-04 ENCOUNTER — Other Ambulatory Visit: Payer: Self-pay | Admitting: Internal Medicine

## 2021-06-05 ENCOUNTER — Ambulatory Visit: Payer: Medicare Other | Admitting: Internal Medicine

## 2021-06-05 ENCOUNTER — Telehealth: Payer: Self-pay

## 2021-06-05 ENCOUNTER — Encounter: Payer: Self-pay | Admitting: Internal Medicine

## 2021-06-05 ENCOUNTER — Other Ambulatory Visit: Payer: Self-pay

## 2021-06-05 VITALS — BP 100/70 | HR 80 | Ht 73.0 in | Wt 282.6 lb

## 2021-06-05 DIAGNOSIS — E278 Other specified disorders of adrenal gland: Secondary | ICD-10-CM | POA: Diagnosis not present

## 2021-06-05 DIAGNOSIS — E782 Mixed hyperlipidemia: Secondary | ICD-10-CM

## 2021-06-05 DIAGNOSIS — E119 Type 2 diabetes mellitus without complications: Secondary | ICD-10-CM

## 2021-06-05 LAB — COMPREHENSIVE METABOLIC PANEL
ALT: 19 U/L (ref 0–35)
AST: 20 U/L (ref 0–37)
Albumin: 3.9 g/dL (ref 3.5–5.2)
Alkaline Phosphatase: 84 U/L (ref 39–117)
BUN: 7 mg/dL (ref 6–23)
CO2: 20 mEq/L (ref 19–32)
Calcium: 8.9 mg/dL (ref 8.4–10.5)
Chloride: 99 mEq/L (ref 96–112)
Creatinine, Ser: 0.65 mg/dL (ref 0.40–1.20)
GFR: 107.42 mL/min (ref >60.00)
Glucose, Bld: 190 mg/dL — ABNORMAL HIGH (ref 70–99)
Potassium: 3.8 mEq/L (ref 3.5–5.1)
Sodium: 131 mEq/L — ABNORMAL LOW (ref 135–145)
Total Bilirubin: 0.3 mg/dL (ref 0.2–1.2)
Total Protein: 7.3 g/dL (ref 6.0–8.3)

## 2021-06-05 LAB — MICROALBUMIN / CREATININE URINE RATIO
Creatinine,U: 183.3 mg/dL
Microalb Creat Ratio: 3.5 mg/g (ref 0.0–30.0)
Microalb, Ur: 6.5 mg/dL — ABNORMAL HIGH (ref 0.0–1.9)

## 2021-06-05 LAB — LIPID PANEL
Cholesterol: 111 mg/dL (ref 0–200)
HDL: 26.3 mg/dL — ABNORMAL LOW (ref >39.00)
LDL Cholesterol: 56 mg/dL (ref 0–99)
NonHDL: 84.28
Total CHOL/HDL Ratio: 4
Triglycerides: 141 mg/dL (ref 0.0–149.0)
VLDL: 28.2 mg/dL (ref 0.0–40.0)

## 2021-06-05 LAB — POCT GLYCOSYLATED HEMOGLOBIN (HGB A1C): Hemoglobin A1C: 8.3 % — AB (ref 4.0–5.6)
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
## 2021-06-05 NOTE — Patient Instructions (Addendum)
Please continue: - Metformin ER 1000 mg 2x a day with meals - Glipizide XL 10 mg before breakfast and add a (crushed) 1 tablet before a larger or later dinner - Trulicity 3 mg weekly on Saturdays am  Please continue: - Lipitor 20 mg daily  Please return in 3-4 months.

## 2021-06-05 NOTE — Telephone Encounter (Addendum)
Called and left a detailed message for pt. ----- Message from Philemon Kingdom, MD sent at 06/05/2021  1:27 PM EST ----- Can you please call pt.:  At today's visit, she had a slightly high glucose, normal kidney function, slightly low good cholesterol, but her bad cholesterol, LDL, was at goal.  Everything else is at goal, including the urinary proteins.

## 2021-06-09 ENCOUNTER — Other Ambulatory Visit: Payer: Self-pay | Admitting: Internal Medicine

## 2021-06-09 DIAGNOSIS — I10 Essential (primary) hypertension: Secondary | ICD-10-CM

## 2021-06-16 ENCOUNTER — Other Ambulatory Visit: Payer: Self-pay | Admitting: Internal Medicine

## 2021-07-09 ENCOUNTER — Encounter: Payer: Medicare Other | Admitting: Internal Medicine
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
## 2021-08-10 ENCOUNTER — Other Ambulatory Visit: Payer: Self-pay | Admitting: Internal Medicine

## 2021-08-10 DIAGNOSIS — F32A Depression, unspecified: Secondary | ICD-10-CM

## 2021-08-10 DIAGNOSIS — F419 Anxiety disorder, unspecified: Secondary | ICD-10-CM

## 2021-10-02 ENCOUNTER — Other Ambulatory Visit: Payer: Self-pay | Admitting: Internal Medicine

## 2021-10-02 DIAGNOSIS — E119 Type 2 diabetes mellitus without complications: Secondary | ICD-10-CM

## 2021-10-05 ENCOUNTER — Encounter: Payer: Self-pay | Admitting: Internal Medicine

## 2021-10-05 ENCOUNTER — Ambulatory Visit: Payer: Medicare Other | Admitting: Internal Medicine

## 2021-10-05 VITALS — BP 130/76 | HR 78 | Ht 73.0 in | Wt 289.8 lb

## 2021-10-05 DIAGNOSIS — E119 Type 2 diabetes mellitus without complications: Secondary | ICD-10-CM

## 2021-10-05 DIAGNOSIS — E782 Mixed hyperlipidemia: Secondary | ICD-10-CM

## 2021-10-05 DIAGNOSIS — E278 Other specified disorders of adrenal gland: Secondary | ICD-10-CM

## 2021-10-05 LAB — POCT GLYCOSYLATED HEMOGLOBIN (HGB A1C): Hemoglobin A1C: 8.7 % — AB (ref 4.0–5.6)

## 2021-10-05 MED ORDER — TRULICITY 4.5 MG/0.5ML ~~LOC~~ SOAJ: 4.5000 mg | SUBCUTANEOUS | 3 refills | Status: DC

## 2021-10-05 MED ORDER — PRODIGY TWIST TOP LANCETS 28G MISC: 3 refills | Status: DC

## 2021-10-05 NOTE — Patient Instructions (Addendum)
Please continue: ?- Metformin ER 1000 mg 2x a day with meals ?- Glipizide XL 10 mg before breakfast and add (crushed) 1 tablet before dinner (more than soup) ? ?Please increase: ?- Trulicity 4.5 mg weekly on Saturdays am ? ?Please continue: ?- Lipitor 20 mg daily ? ?Please return in 3-4 months. ?
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
## 2021-10-05 NOTE — Addendum Note (Signed)
Addended by: Lauralyn Primes on: 10/05/2021 04:01 PM ? ? Modules accepted: Orders ? ?

## 2021-10-20 LAB — EXTRA SPECIMEN

## 2021-10-20 LAB — METANEPHRINES, PLASMA
Metanephrine, Free: <25 pg/mL (ref <=57)
Normetanephrine, Free: 54 pg/mL (ref <=148)
Total Metanephrines-Plasma: 54 pg/mL (ref <=205)

## 2021-10-20 LAB — POTASSIUM: Potassium: 3.8 mmol/L (ref 3.5–5.3)

## 2021-10-20 LAB — CATECHOLAMINES, FRACTIONATED, PLASMA
Dopamine: <10 pg/mL
Epinephrine: 35 pg/mL
Norepinephrine: 321 pg/mL
Total Catecholamines: 356 pg/mL

## 2021-10-20 LAB — ALDOSTERONE + RENIN ACTIVITY W/ RATIO
ALDO / PRA Ratio: 2.9 Ratio (ref 0.9–28.9)
Aldosterone: 3 ng/dL
Renin Activity: 1.05 ng/mL/h (ref 0.25–5.82)

## 2021-11-04 IMAGING — MR MR ABDOMEN WO/W CM
9 of 11 series · 32 of 48 positions shown · IV contrast (20ml Multihance)
Comparison: CT on 04/26/2019

CLINICAL DATA: Indeterminate left adrenal mass.

EXAM:
MRI ABDOMEN WITHOUT AND WITH CONTRAST
TECHNIQUE: Multiplanar multisequence MR imaging of the abdomen was performed
both before and after the administration of intravenous contrast.
The patient experienced vomiting after intravenous contrast
injection, and only an early 25 second postcontrast sequence could
be obtained. Dynamic imaging could not be performed.
CONTRAST:  20mL MULTIHANCE GADOBENATE DIMEGLUMINE 529 MG/ML IV SOLN

[Series 2: T2 · coronal · 5.0mm · 1.64mm/px · 1 of 36 slices shown (1 of 3)]
[im 1/36]
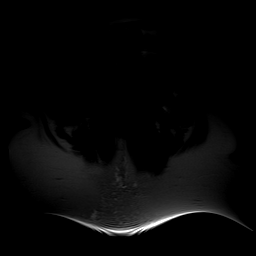

[Series 3: T2 · axial · 5.0mm · 1.56mm/px · 1 of 44 slices shown (2 of 3)]
[im 1/44]
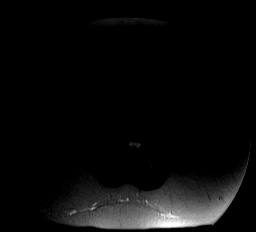

[Series 4: axial in out · axial · 5.5mm · 0.78mm/px · z∈[-97,+149]mm · 4 of 80 slices shown]
[im 1/80]
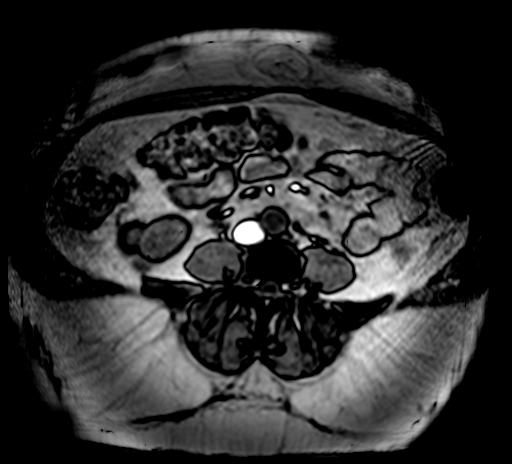
[im 27/80]
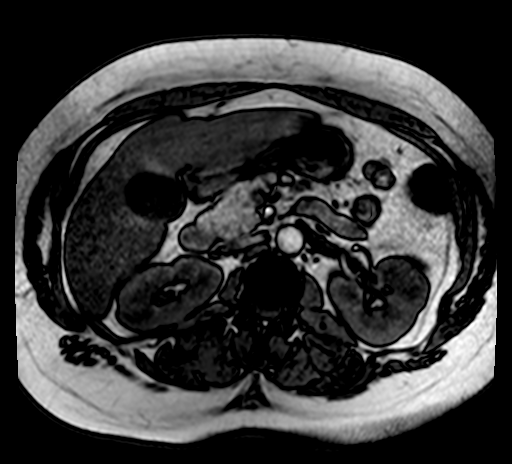
[im 53/80]
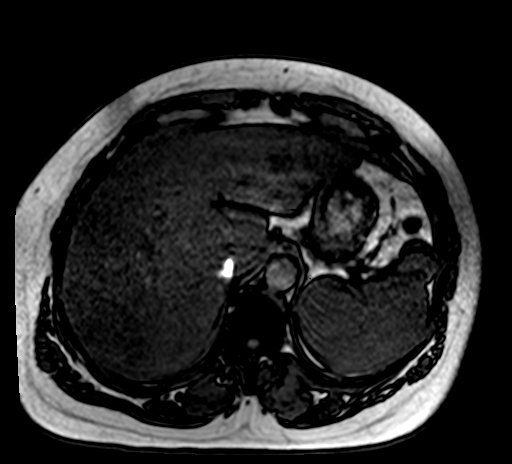
[im 80/80]
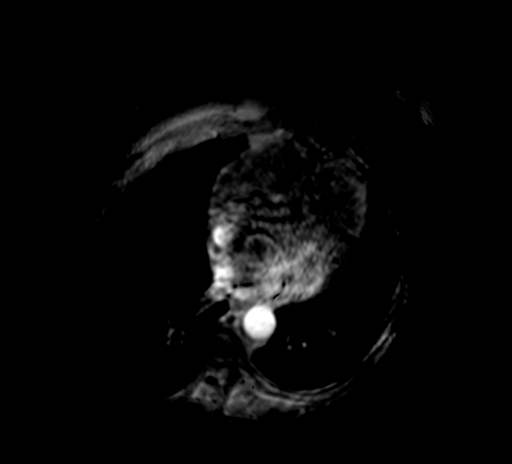

[Series 5: axial tru fisp · axial · 5.0mm · 1.56mm/px · z∈[-121,+173]mm · 3 of 52 slices shown]
[im 1/52]
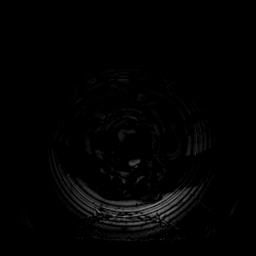
[im 26/52]
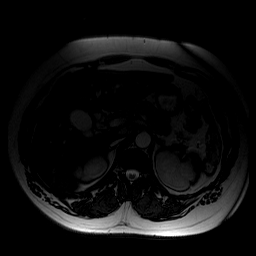
[im 52/52]
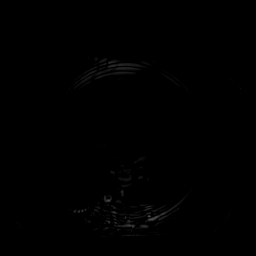

[Series 6: cor in out · coronal · 5.5mm · 0.86mm/px · 4 of 68 slices shown]
[im 1/68]
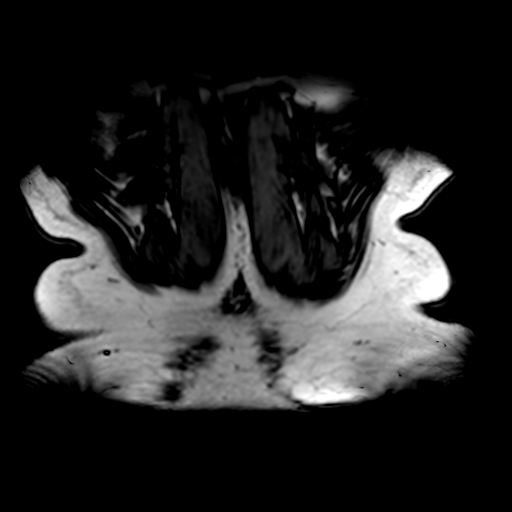
[im 23/68]
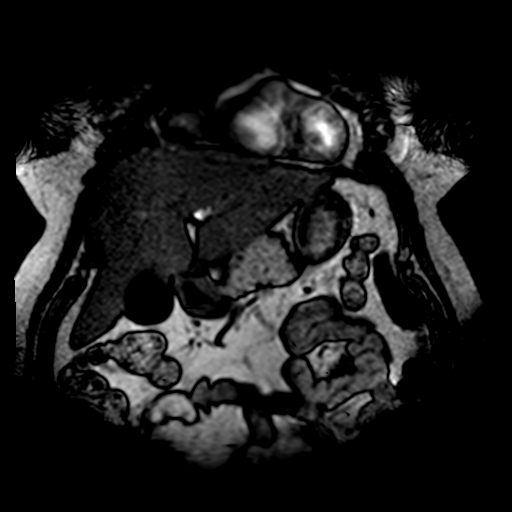
[im 45/68]
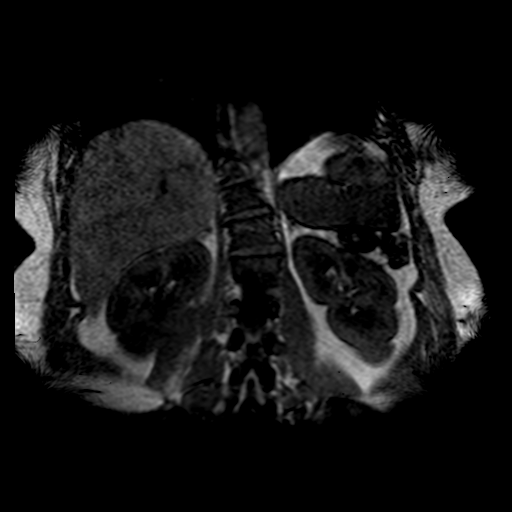
[im 68/68]
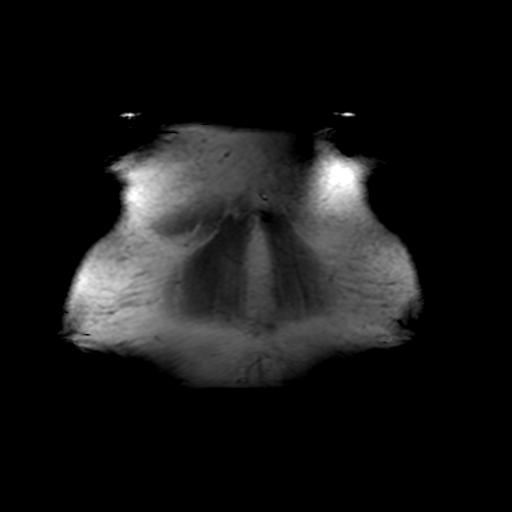

[Series 7: T2 · axial · 5.0mm · 0.82mm/px · z∈[-114,+168]mm · 3 of 46 slices shown (3 of 3)]
[im 1/46]
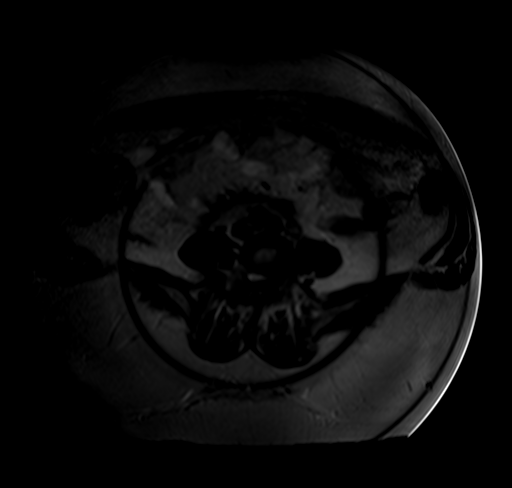
[im 23/46]
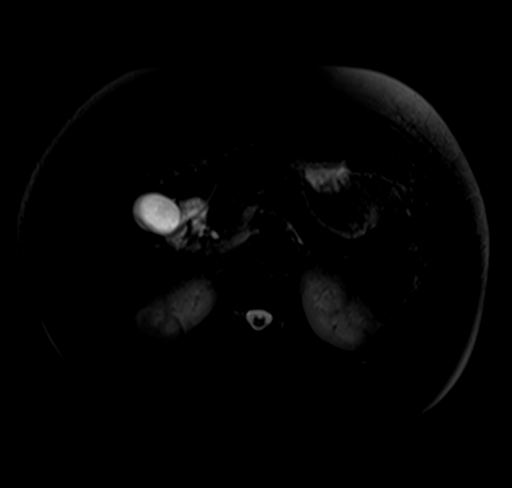
[im 46/46]
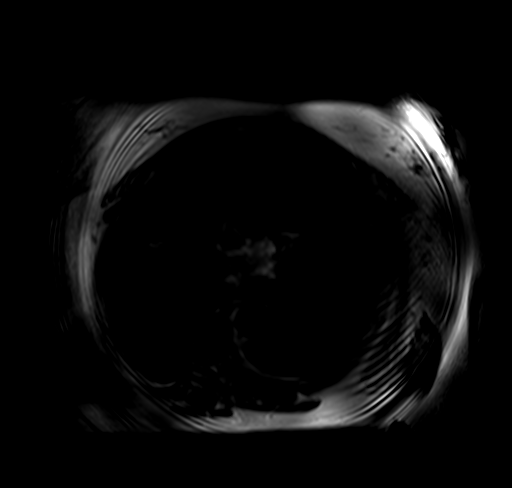

[Series 8: ep2d_[id number]_500_800_p2_trig · axial · 5.0mm · 2.08mm/px · z∈[-114,+168]mm · 8 of 138 slices shown]
[im 1/138]
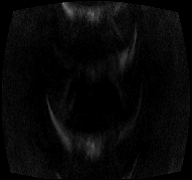
[im 20/138]
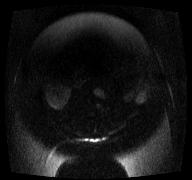
[im 40/138]
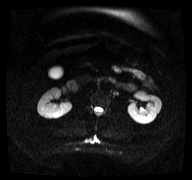
[im 59/138]
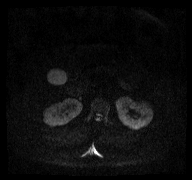
[im 79/138]
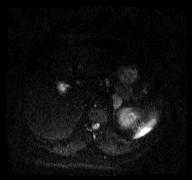
[im 98/138]
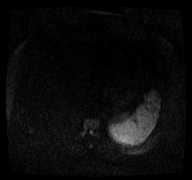
[im 118/138]
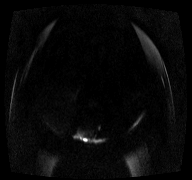
[im 138/138]
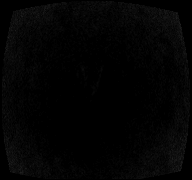

[Series 9: ep2d_[id number]_p2_trig_adc · axial · 5.0mm · 2.08mm/px · z∈[-114,+168]mm · 3 of 46 slices shown]
[im 1/46]
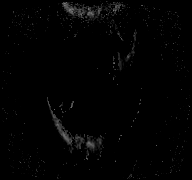
[im 23/46]
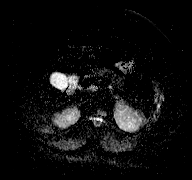
[im 46/46]
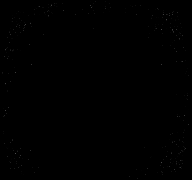

[Series 10: T1 dynamic · axial · non-contrast · 2.2mm · 0.78mm/px · z∈[-100,+73]mm · 5 of 120 slices shown]
[im 1/120]
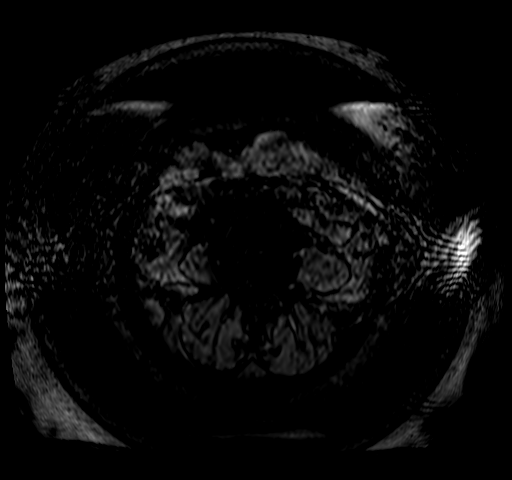
[im 20/120]
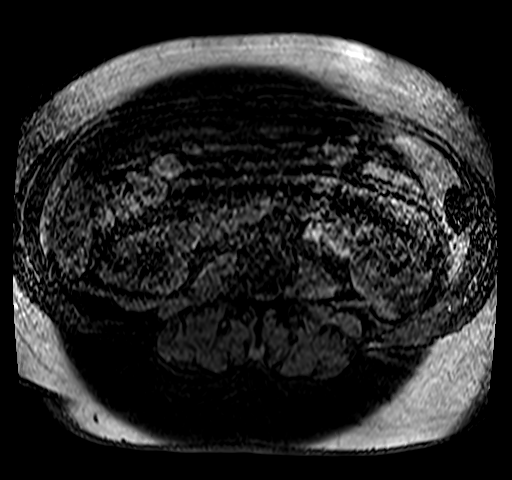
[im 40/120]
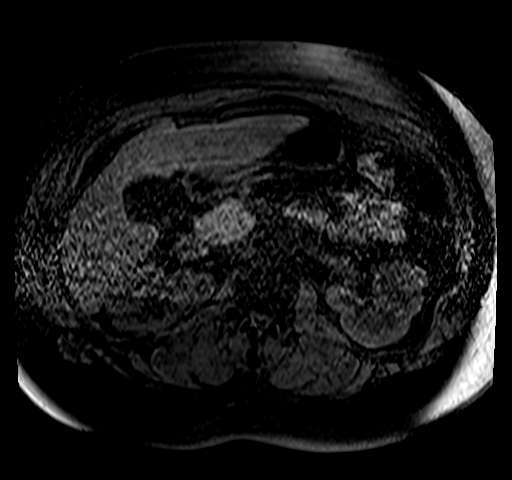
[im 60/120]
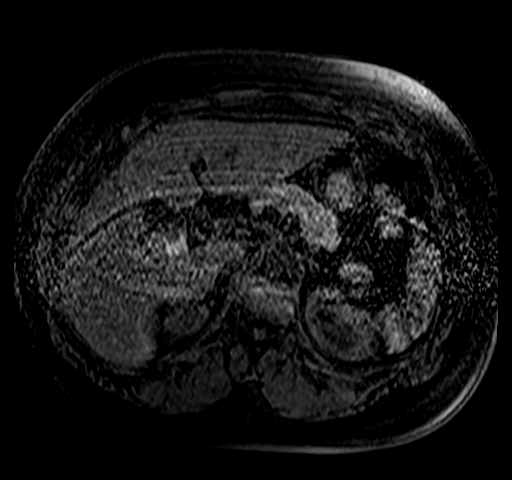
[im 80/120]
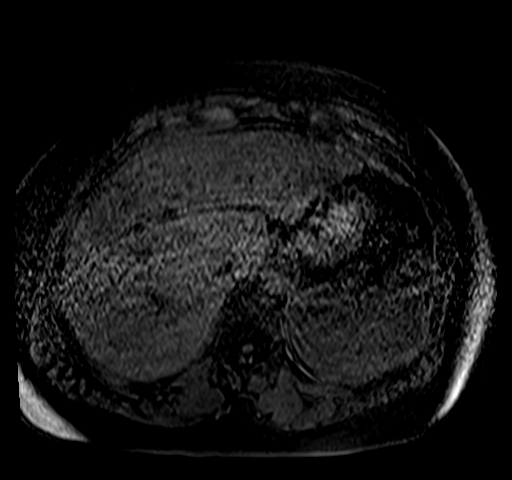

[32 of 48 positions shown; findings below may reference images not displayed]

FINDINGS: Lower chest: No acute findings.

Hepatobiliary: No hepatic masses identified. Hepatic steatosis is
noted. Gallbladder is unremarkable. No evidence of biliary ductal
dilatation.

Pancreas:  No mass or inflammatory changes.

Spleen:  Within normal limits in size and appearance.

Adrenals/Urinary Tract: A 2.5 cm left adrenal mass is seen which
shows areas of internal signal dropout on chemical shift imaging,
consistent with benign adrenal adenoma. The right adrenal gland is
normal in appearance. Both kidneys are also normal in appearance.

Stomach/Bowel: Visualized portion unremarkable.

Vascular/Lymphatic: No pathologically enlarged lymph nodes
identified. No abdominal aortic aneurysm.

Other: Small midline ventral abdominal wall hernia is seen
containing only fat.

Musculoskeletal:  No suspicious bone lesions identified.
IMPRESSION: 1. 2.5 cm benign left adrenal adenoma.
2. Hepatic steatosis.
3. Small midline ventral hernia containing only fat.

## 2021-11-29 ENCOUNTER — Other Ambulatory Visit: Payer: Self-pay | Admitting: Internal Medicine

## 2021-11-29 DIAGNOSIS — I10 Essential (primary) hypertension: Secondary | ICD-10-CM

## 2022-02-08 ENCOUNTER — Ambulatory Visit: Payer: Medicare Other | Admitting: Internal Medicine

## 2022-02-08 ENCOUNTER — Encounter: Payer: Self-pay | Admitting: Internal Medicine

## 2022-02-08 VITALS — BP 128/82 | HR 92 | Ht 73.0 in | Wt 288.0 lb

## 2022-02-08 DIAGNOSIS — E278 Other specified disorders of adrenal gland: Secondary | ICD-10-CM | POA: Diagnosis not present

## 2022-02-08 DIAGNOSIS — E119 Type 2 diabetes mellitus without complications: Secondary | ICD-10-CM | POA: Diagnosis not present

## 2022-02-08 DIAGNOSIS — E782 Mixed hyperlipidemia: Secondary | ICD-10-CM | POA: Diagnosis not present

## 2022-02-08 LAB — POCT GLYCOSYLATED HEMOGLOBIN (HGB A1C): Hemoglobin A1C: 9.2 % — AB (ref 4.0–5.6)

## 2022-02-08 MED ORDER — DEXAMETHASONE 1 MG PO TABS: ORAL_TABLET | ORAL | 0 refills | Status: DC

## 2022-02-08 MED ORDER — TIRZEPATIDE 5 MG/0.5ML ~~LOC~~ SOAJ: 5.0000 mg | SUBCUTANEOUS | 3 refills | Status: DC

## 2022-02-08 NOTE — Patient Instructions (Addendum)
Please take the Dexamethasone at 11 pm the night before coming for labs at 8 am. Please come fasting.  Please continue: - Metformin ER 1000 mg 2x a day with meals - Glipizide XL 10 mg before breakfast and 1 (crushed) tablet before dinner  Try to replace Trulicity with: - Mounjaro 5 mg weekly on Saturdays am  Please continue: - Lipitor 20 mg daily  Please return in 3-4 months.
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
## 2022-02-22 ENCOUNTER — Other Ambulatory Visit (INDEPENDENT_AMBULATORY_CARE_PROVIDER_SITE_OTHER): Payer: Medicare Other

## 2022-02-22 ENCOUNTER — Encounter: Payer: Self-pay | Admitting: Internal Medicine

## 2022-02-22 DIAGNOSIS — E278 Other specified disorders of adrenal gland: Secondary | ICD-10-CM | POA: Diagnosis not present

## 2022-02-23 LAB — CORTISOL-AM, BLOOD: Cortisol - AM: 0.8 ug/dL — ABNORMAL LOW

## 2022-03-21 ENCOUNTER — Telehealth: Payer: Self-pay

## 2022-03-21 NOTE — Telephone Encounter (Signed)
Lvm for pt to call back. 

## 2022-03-21 NOTE — Telephone Encounter (Signed)
T, I would like her to try a higher dose of Mounjaro first, since she is on the relatively low dose.  Can she do 2 injections of the 5 mg and let us know how this is working for her?  If this works well, I can call in the 10 mg dose.

## 2022-03-21 NOTE — Telephone Encounter (Signed)
Pt called to advise Mounjaro not helping with blood sugars and would like to go back on Trulicity.

## 2022-03-25 NOTE — Telephone Encounter (Signed)
Contacted pt to increase Mounjaro dose and if tolerated well we can send rx to pharmacy.

## 2022-04-02 ENCOUNTER — Other Ambulatory Visit: Payer: Self-pay | Admitting: Internal Medicine

## 2022-04-02 DIAGNOSIS — E119 Type 2 diabetes mellitus without complications: Secondary | ICD-10-CM

## 2022-05-28 ENCOUNTER — Telehealth: Payer: Self-pay

## 2022-05-28 DIAGNOSIS — E119 Type 2 diabetes mellitus without complications: Secondary | ICD-10-CM

## 2022-05-28 MED ORDER — METFORMIN HCL ER 500 MG PO TB24: 1000.0000 mg | ORAL_TABLET | Freq: Two times a day (BID) | ORAL | 3 refills | Status: DC

## 2022-05-28 NOTE — Telephone Encounter (Signed)
Pt called requesting a refill for Metformin. Ok to refill?

## 2022-05-28 NOTE — Telephone Encounter (Signed)
Yes, I am managing her diabetes -please see latest OV note

## 2022-05-28 NOTE — Telephone Encounter (Signed)
Rx sent to preferred pharmacy.

## 2022-05-29 ENCOUNTER — Other Ambulatory Visit: Payer: Self-pay | Admitting: Internal Medicine

## 2022-05-29 DIAGNOSIS — F32A Depression, unspecified: Secondary | ICD-10-CM

## 2022-05-29 DIAGNOSIS — F419 Anxiety disorder, unspecified: Secondary | ICD-10-CM

## 2022-06-06 NOTE — Telephone Encounter (Signed)
30 day refill to allow for follow up visit scheduled for 06/13/22

## 2022-06-09 ENCOUNTER — Other Ambulatory Visit: Payer: Self-pay | Admitting: Internal Medicine

## 2022-06-09 DIAGNOSIS — I10 Essential (primary) hypertension: Secondary | ICD-10-CM

## 2022-06-11 ENCOUNTER — Ambulatory Visit: Payer: Medicare Other | Admitting: Internal Medicine

## 2022-06-13 ENCOUNTER — Other Ambulatory Visit: Payer: Self-pay

## 2022-06-13 ENCOUNTER — Ambulatory Visit (INDEPENDENT_AMBULATORY_CARE_PROVIDER_SITE_OTHER): Payer: Medicare Other | Admitting: Student

## 2022-06-13 ENCOUNTER — Other Ambulatory Visit (HOSPITAL_COMMUNITY)
Admission: RE | Admit: 2022-06-13 | Discharge: 2022-06-13 | Disposition: A | Discharge status: Home / Self Care | Payer: Medicare Other | Source: Ambulatory Visit | Attending: Student in an Organized Health Care Education/Training Program | Admitting: Student in an Organized Health Care Education/Training Program

## 2022-06-13 ENCOUNTER — Encounter: Payer: Self-pay | Admitting: Student

## 2022-06-13 VITALS — BP 112/63 | HR 90 | Temp 98.9°F | Ht 73.0 in | Wt 289.0 lb

## 2022-06-13 DIAGNOSIS — N946 Dysmenorrhea, unspecified: Secondary | ICD-10-CM

## 2022-06-13 DIAGNOSIS — Z1151 Encounter for screening for human papillomavirus (HPV): Secondary | ICD-10-CM | POA: Insufficient documentation

## 2022-06-13 DIAGNOSIS — Z3202 Encounter for pregnancy test, result negative: Secondary | ICD-10-CM | POA: Diagnosis not present

## 2022-06-13 DIAGNOSIS — Z124 Encounter for screening for malignant neoplasm of cervix: Secondary | ICD-10-CM | POA: Insufficient documentation

## 2022-06-13 DIAGNOSIS — Z01419 Encounter for gynecological examination (general) (routine) without abnormal findings: Secondary | ICD-10-CM | POA: Insufficient documentation

## 2022-06-13 DIAGNOSIS — E119 Type 2 diabetes mellitus without complications: Secondary | ICD-10-CM | POA: Diagnosis not present

## 2022-06-13 DIAGNOSIS — F32A Depression, unspecified: Secondary | ICD-10-CM | POA: Diagnosis not present

## 2022-06-13 DIAGNOSIS — I10 Essential (primary) hypertension: Secondary | ICD-10-CM

## 2022-06-13 DIAGNOSIS — F419 Anxiety disorder, unspecified: Secondary | ICD-10-CM

## 2022-06-13 DIAGNOSIS — Z0000 Encounter for general adult medical examination without abnormal findings: Secondary | ICD-10-CM

## 2022-06-13 LAB — POCT GLYCOSYLATED HEMOGLOBIN (HGB A1C): Hemoglobin A1C: 9.8 % — AB (ref 4.0–5.6)

## 2022-06-13 LAB — POCT URINE PREGNANCY: Preg Test, Ur: NEGATIVE

## 2022-06-13 LAB — GLUCOSE, CAPILLARY: Glucose-Capillary: 134 mg/dL — ABNORMAL HIGH (ref 70–99)

## 2022-06-13 MED ORDER — SERTRALINE HCL 100 MG PO TABS: 100.0000 mg | ORAL_TABLET | Freq: Every day | ORAL | 2 refills | Status: DC

## 2022-06-13 NOTE — Assessment & Plan Note (Signed)
She reports several days per week of decreased interest, sadness, decreased energy, sleep problems, thoughts that she would be better off dead.  This has been going on for several weeks to months.  She describes limited support from her relationships.  Her parents are deceased and she thinks about them frequently.  She says it would be good if she could join them in heaven.  She reports that she has no plans to harm or kill herself.  PHQ-9 score 17.  She states her religion is a protective factor.  On exam she is tearful at times, but is quick to joke and laugh.    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  I will check a TSH level.  I recommend increasing sertraline to 100 mg daily for the time being.  I also recommend referral to counseling services.

## 2022-06-13 NOTE — Assessment & Plan Note (Addendum)
Follow-up with [facility] endocrinology.  Has been difficult to manage of late.  Hemoglobin A1c today.  Urine ACR today.

## 2022-06-13 NOTE — Patient Instructions (Addendum)
Hoy hablamos de depresin y diabetes.  Para su depresin, aumentar su sertralina (Zoloft) a 100 mg al da. Envi una nueva receta a su farmacia. Contine tomando una tableta al [employer]. Tambin ordenar algunos estudios de laboratorio para descartar causas subyacentes de la depresin.  Para su diabetes, hoy comprobar su hemoglobina A1c y su nivel de Museum/gallery exhibitions officer. Tambin comprobar su funcin renal.  Lo llamar con los resultados de las siguientes pruebas de laboratorio:  rdenes de laboratorio      BMP8+brecha aninica      Relacin microalbmina/creatinina en [place]      CBC sin diferencia      TSH      POC Hbg A1C      POCT [facility] [place]  Llame a nuestra clnica al [phone] de lunes a viernes de 9 am a 4 pm si tiene preguntas o inquietudes [employer]. Si es fuera del horario de atencin o en fin de semana, llame al nmero principal del hospital y pregunte por el residente de guardia de medicina interna. Si necesita reabastecimiento de medicamentos, notifique a su farmacia con una semana de anticipacin y ellos nos enviarn una solicitud.  Mejor, Nani Gasser, [place] ___  Today we discussed depression and diabetes.  For your depression, I will increase your sertraline (Zoloft) to 100 mg daily. I sent a new prescription to your pharmacy. Continue taking one tablet daily. I will also order some laboratory studies to rule out underlying causes of depression.  For your diabetes, I will check your hemoglobin A1c and blood sugar today. I will also check your kidney function.  I will call you with the results of the following laboratory test(s):  Lab Orders         BMP8+Anion Gap         Microalbumin / Creatinine Urine Ratio         CBC no Diff         TSH         POC Hbg A1C         POCT Urine Pregnancy      Pap smear  Please call our clinic at [phone] Monday through Friday from 9 am to 4 pm if you  have questions or concerns about your health. If after hours or on the weekend, call the main hospital number and ask for the Internal Medicine Resident On-Call. If you need medication refills, please notify your pharmacy one week in advance and they will send [place] a request.   Best, Nani Gasser, [place]

## 2022-06-13 NOTE — Assessment & Plan Note (Signed)
Pap smear today. 

## 2022-06-13 NOTE — Progress Notes (Signed)
Subjective:  [name] is a 47 y.o. who presents to clinic for routine follow-up.  Her chief concern is feelings of depression.    Patient Active Problem List   Diagnosis Date Noted  . UTI (urinary tract infection) 02/15/2020  . Musculoskeletal back pain 02/15/2020  . Hypocalcemia 01/05/2020  . Fatigue 10/21/2019  . Left adrenal mass ([place]) 07/13/2019  . Verruca 07/12/2019  . Benign skin lesion of neck 01/05/2019  . Anxiety 06/26/2018  . Transaminitis 06/26/2018  . Hx of Intraductal papilloma of L breast 10/17/2017  . Seborrheic keratosis of R breast 01/13/2017  . Dysmenorrhea 08/16/2016  . Supraumbilical hernia 29/79/8921  . Type 2 diabetes mellitus ([place]) 05/17/2016  . Depression 05/17/2016  . Microcytic anemia 05/17/2016  . Essential hypertension 05/17/2016  . Chronic venous insufficiency 03/25/2014  . Blindness of both eyes 02/07/2012   Objective:   Vitals:   06/13/22 0918  BP: 112/63  Pulse: 90  Temp: 98.9 F (37.2 C)  TempSrc: Oral  SpO2: 97%  Weight: 289 lb (131.1 kg)  Height: '6\' 1"'$  (1.854 m)    Physical Exam Exam conducted with a chaperone present.  Constitutional:      General: She is not in acute distress.    Appearance: Normal appearance.  HENT:     Mouth/Throat:     Mouth: Mucous membranes are moist.  Cardiovascular:     Rate and Rhythm: Normal rate and regular rhythm.     Pulses: Normal pulses.  Pulmonary:     Effort: Pulmonary effort is normal.     Breath sounds: Normal breath sounds. No stridor.  Abdominal:     Palpations: Abdomen is soft.     Tenderness: There is no abdominal tenderness.  Genitourinary:    Cervix: Normal.     Rectum: External hemorrhoid present.  Musculoskeletal:     Right lower leg: No edema.     Left lower leg: No edema.  Lymphadenopathy:     Cervical: No cervical adenopathy.  Skin:    General: Skin is warm and dry.  Neurological:     Mental Status: She is alert. Mental status is at baseline.   Psychiatric:        Mood and Affect: Mood normal.        Behavior: Behavior normal.    Assessment & Plan:  The primary encounter diagnosis was Dysmenorrhea. Diagnoses of Anxiety, Depression, unspecified depression type, Type 2 diabetes mellitus without complication, without long-term current use of insulin ([place]), Essential hypertension, Pap smear for cervical cancer screening, and Healthcare maintenance were also pertinent to this visit.  Type 2 diabetes mellitus ([place]) Follow-up with [facility] endocrinology.  Has been difficult to manage of late.  Hemoglobin A1c today.  Urine ACR today.  Depression She reports several days per week of decreased interest, sadness, decreased energy, sleep problems, thoughts that she would be better off dead.  This has been going on for several weeks to months.  She describes limited support from her relationships.  Her parents are deceased and she thinks about them frequently.  She says it would be good if she could join them in heaven.  She reports that she has no plans to harm or kill herself.  PHQ-9 score 17.  She states her religion is a protective factor.  On exam she is tearful at times, but is quick to joke and laugh.    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  I will  check a TSH level.  I recommend increasing sertraline to 100 mg daily for the time being.  I also recommend referral to counseling services.  Healthcare maintenance Pap smear today.    Return in 3 months for follow up of depression and diabetes.  Patient discussed with Dr. Luna Kitchens MD 06/13/2022, 10:13 PM  Pager: [phone]

## 2022-06-14 LAB — BMP8+ANION GAP
Anion Gap: 15 mmol/L (ref 10.0–18.0)
BUN/Creatinine Ratio: 13 (ref 9–23)
BUN: 6 mg/dL (ref 6–24)
CO2: 21 mmol/L (ref 20–29)
Calcium: 9 mg/dL (ref 8.7–10.2)
Chloride: 99 mmol/L (ref 96–106)
Creatinine, Ser: 0.48 mg/dL — ABNORMAL LOW (ref 0.57–1.00)
Glucose: 145 mg/dL — ABNORMAL HIGH (ref 70–99)
Potassium: 3.8 mmol/L (ref 3.5–5.2)
Sodium: 135 mmol/L (ref 134–144)
eGFR: 120 mL/min/{1.73_m2} (ref >59)

## 2022-06-14 LAB — CBC
Hematocrit: 37.8 % (ref 34.0–46.6)
Hemoglobin: 12 g/dL (ref 11.1–15.9)
MCH: 22.9 pg — ABNORMAL LOW (ref 26.6–33.0)
MCHC: 31.7 g/dL (ref 31.5–35.7)
MCV: 72 fL — ABNORMAL LOW (ref 79–97)
Platelets: 349 10*3/uL (ref 150–450)
RBC: 5.25 x10E6/uL (ref 3.77–5.28)
RDW: 16.6 % — ABNORMAL HIGH (ref 11.7–15.4)
WBC: 9.8 10*3/uL (ref 3.4–10.8)

## 2022-06-14 LAB — TSH: TSH: 1.37 u[IU]/mL (ref 0.450–4.500)

## 2022-06-14 NOTE — Progress Notes (Signed)
Results notable for hemoglobin A1c of 9.8, negative urine pregnancy test, TSH within normal limits.  Patient was called to communicate these results. She has follow up with endocrinology on 06/28/22.

## 2022-06-16 LAB — MICROALBUMIN / CREATININE URINE RATIO
Creatinine, Urine: 26.7 mg/dL
Microalb/Creat Ratio: 28 mg/g creat (ref 0–29)
Microalbumin, Urine: 7.5 ug/mL

## 2022-06-17 LAB — CYTOLOGY - PAP
Comment: NEGATIVE
Diagnosis: NEGATIVE
High risk HPV: NEGATIVE

## 2022-06-17 NOTE — Progress Notes (Signed)
Internal Medicine Clinic Attending  Case discussed with Dr. McLendon  At the time of the visit.  We reviewed the resident's history and exam and pertinent patient test results.  I agree with the assessment, diagnosis, and plan of care documented in the resident's note.  

## 2022-06-17 NOTE — Addendum Note (Signed)
Addended by: Jodean Lima on: 06/17/2022 09:08 AM   Modules accepted: Level of Service

## 2022-06-17 NOTE — Progress Notes (Signed)
Pap test normal.  Negative for intraepithelial lesion or malignancy.  Negative for high-risk HPV.  Repeat Pap test with HPV cotesting in 5 years.  Called patient and left voicemail to inform her.

## 2022-06-28 ENCOUNTER — Encounter: Payer: Self-pay | Admitting: Internal Medicine

## 2022-06-28 ENCOUNTER — Ambulatory Visit: Payer: Medicare Other | Admitting: Internal Medicine

## 2022-06-28 VITALS — BP 118/62 | HR 97 | Ht 73.0 in | Wt 280.4 lb

## 2022-06-28 DIAGNOSIS — E119 Type 2 diabetes mellitus without complications: Secondary | ICD-10-CM

## 2022-06-28 DIAGNOSIS — E278 Other specified disorders of adrenal gland: Secondary | ICD-10-CM | POA: Diagnosis not present

## 2022-06-28 DIAGNOSIS — E782 Mixed hyperlipidemia: Secondary | ICD-10-CM | POA: Diagnosis not present

## 2022-06-28 MED ORDER — TRULICITY 4.5 MG/0.5ML ~~LOC~~ SOAJ: 4.5000 mg | SUBCUTANEOUS | 3 refills | Status: DC

## 2022-06-28 MED ORDER — GLIPIZIDE ER 5 MG PO TB24: 10.0000 mg | ORAL_TABLET | Freq: Every day | ORAL | 3 refills | Status: DC

## 2022-06-28 NOTE — Patient Instructions (Addendum)
Please continue: - Metformin ER 1000 mg 2x a day with meals - Glipizide XL 10 mg before breakfast  - Trulicity 4.5 mg weekly   Please return in 3-4 months.
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
## 2022-07-05 ENCOUNTER — Other Ambulatory Visit: Payer: Self-pay | Admitting: Student

## 2022-07-05 DIAGNOSIS — F32A Depression, unspecified: Secondary | ICD-10-CM

## 2022-07-05 DIAGNOSIS — F419 Anxiety disorder, unspecified: Secondary | ICD-10-CM

## 2022-09-12 ENCOUNTER — Encounter: Payer: Medicare Other | Admitting: Student

## 2022-09-23 ENCOUNTER — Other Ambulatory Visit: Payer: Self-pay | Admitting: Internal Medicine

## 2022-10-28 ENCOUNTER — Ambulatory Visit: Payer: Medicare Other | Admitting: Internal Medicine

## 2023-01-11 ENCOUNTER — Other Ambulatory Visit: Payer: Self-pay | Admitting: Internal Medicine

## 2023-01-11 DIAGNOSIS — F32A Depression, unspecified: Secondary | ICD-10-CM

## 2023-01-11 DIAGNOSIS — F419 Anxiety disorder, unspecified: Secondary | ICD-10-CM

## 2023-01-29 ENCOUNTER — Other Ambulatory Visit: Payer: Self-pay | Admitting: Student

## 2023-01-29 ENCOUNTER — Other Ambulatory Visit: Payer: Self-pay | Admitting: Internal Medicine

## 2023-01-29 DIAGNOSIS — E119 Type 2 diabetes mellitus without complications: Secondary | ICD-10-CM

## 2023-01-29 DIAGNOSIS — I10 Essential (primary) hypertension: Secondary | ICD-10-CM

## 2023-02-11 ENCOUNTER — Other Ambulatory Visit: Payer: Self-pay

## 2023-02-14 ENCOUNTER — Ambulatory Visit: Payer: Medicare Other | Admitting: Internal Medicine

## 2023-02-17 ENCOUNTER — Encounter: Payer: Medicare Other | Admitting: Internal Medicine

## 2023-03-03 ENCOUNTER — Ambulatory Visit (INDEPENDENT_AMBULATORY_CARE_PROVIDER_SITE_OTHER): Payer: Medicare Other | Admitting: Internal Medicine

## 2023-03-03 ENCOUNTER — Encounter: Payer: Self-pay | Admitting: Internal Medicine

## 2023-03-03 VITALS — BP 103/62 | HR 85 | Resp 16 | Ht 73.0 in | Wt 269.4 lb

## 2023-03-03 DIAGNOSIS — Z7984 Long term (current) use of oral hypoglycemic drugs: Secondary | ICD-10-CM

## 2023-03-03 DIAGNOSIS — E278 Other specified disorders of adrenal gland: Secondary | ICD-10-CM

## 2023-03-03 DIAGNOSIS — E782 Mixed hyperlipidemia: Secondary | ICD-10-CM

## 2023-03-03 DIAGNOSIS — E119 Type 2 diabetes mellitus without complications: Secondary | ICD-10-CM

## 2023-03-03 DIAGNOSIS — Z7985 Long-term (current) use of injectable non-insulin antidiabetic drugs: Secondary | ICD-10-CM | POA: Diagnosis not present

## 2023-03-03 LAB — POCT GLYCOSYLATED HEMOGLOBIN (HGB A1C): Hemoglobin A1C: 11.1 % — AB (ref 4.0–5.6)

## 2023-03-03 MED ORDER — DEXAMETHASONE 1 MG PO TABS: ORAL_TABLET | ORAL | 0 refills | Status: DC

## 2023-03-03 MED ORDER — INSULIN PEN NEEDLE 32G X 4 MM MISC: 3 refills | Status: DC

## 2023-03-03 MED ORDER — TRULICITY 4.5 MG/0.5ML ~~LOC~~ SOAJ: 4.5000 mg | SUBCUTANEOUS | 3 refills | Status: DC

## 2023-03-03 MED ORDER — LANTUS SOLOSTAR 100 UNIT/ML ~~LOC~~ SOPN
20.0000 [IU] | PEN_INJECTOR | Freq: Every day | SUBCUTANEOUS | 99 refills | Status: DC
Start: 2023-03-03 — End: 2023-08-15

## 2023-03-03 MED ORDER — PRODIGY VOICE BLOOD GLUCOSE W/DEVICE KIT: PACK | 0 refills | Status: DC

## 2023-03-03 MED ORDER — GLIPIZIDE ER 5 MG PO TB24
10.0000 mg | ORAL_TABLET | Freq: Every day | ORAL | 3 refills | Status: DC
Start: 2023-03-03 — End: 2024-02-10

## 2023-03-03 NOTE — Patient Instructions (Addendum)
Please continue: - Metformin ER 1000 mg 2x a day with meals - Glipizide XL 10 mg before breakfast   Try to restart: - Trulicity 4.5 mg weekly   Start: - Lantus 12 units daily at bedtime. Increase the Lantus by 2 units every 2 days until the sugars in am are <130  Please stop at the lab.  Please return in 3-4 months.

## 2023-03-04 LAB — COMPREHENSIVE METABOLIC PANEL
ALT: 30 U/L (ref 0–35)
AST: 20 U/L (ref 0–37)
Albumin: 3.8 g/dL (ref 3.5–5.2)
Alkaline Phosphatase: 98 U/L (ref 39–117)
BUN: 15 mg/dL (ref 6–23)
CO2: 23 mEq/L (ref 19–32)
Calcium: 8.9 mg/dL (ref 8.4–10.5)
Chloride: 96 mEq/L (ref 96–112)
Creatinine, Ser: 1 mg/dL (ref 0.40–1.20)
GFR: 67.94 mL/min (ref >60.00)
Glucose, Bld: 318 mg/dL — ABNORMAL HIGH (ref 70–99)
Potassium: 3.9 mEq/L (ref 3.5–5.1)
Sodium: 131 mEq/L — ABNORMAL LOW (ref 135–145)
Total Bilirubin: 0.5 mg/dL (ref 0.2–1.2)
Total Protein: 6.8 g/dL (ref 6.0–8.3)

## 2023-03-04 LAB — LIPID PANEL
Cholesterol: 72 mg/dL (ref 0–200)
HDL: 20.3 mg/dL — ABNORMAL LOW (ref >39.00)
LDL Cholesterol: 24 mg/dL (ref 0–99)
NonHDL: 51.26
Total CHOL/HDL Ratio: 4
Triglycerides: 135 mg/dL (ref 0.0–149.0)
VLDL: 27 mg/dL (ref 0.0–40.0)

## 2023-03-04 LAB — MICROALBUMIN / CREATININE URINE RATIO
Creatinine,U: 229.1 mg/dL
Microalb Creat Ratio: 25.1 mg/g (ref 0.0–30.0)
Microalb, Ur: 57.5 mg/dL — ABNORMAL HIGH (ref 0.0–1.9)

## 2023-03-10 ENCOUNTER — Other Ambulatory Visit: Payer: Self-pay | Admitting: Student

## 2023-03-10 DIAGNOSIS — F32A Depression, unspecified: Secondary | ICD-10-CM

## 2023-03-10 DIAGNOSIS — F419 Anxiety disorder, unspecified: Secondary | ICD-10-CM

## 2023-03-13 ENCOUNTER — Telehealth: Payer: Self-pay

## 2023-03-13 LAB — ALDOSTERONE + RENIN ACTIVITY W/ RATIO
ALDO / PRA Ratio: 0.8 {ratio} — ABNORMAL LOW (ref 0.9–28.9)
Aldosterone: 5 ng/dL
Renin Activity: 6.31 ng/mL/h — ABNORMAL HIGH (ref 0.25–5.82)

## 2023-03-13 LAB — METANEPHRINES, PLASMA
Metanephrine, Free: <25 pg/mL (ref <=57)
Normetanephrine, Free: 91 pg/mL (ref <=148)
Total Metanephrines-Plasma: 91 pg/mL (ref <=205)

## 2023-03-13 NOTE — Telephone Encounter (Signed)
Patient attempted to give lab results as directed by MD.

## 2023-03-13 NOTE — Telephone Encounter (Signed)
-----   Message from Carlus Pavlov sent at 03/13/2023  4:04 PM EDT ----- Can you please call pt.:  Lipids at goal with exception of the low HDL (good cholesterol) Her urine proteins are normal. Her kidney function is normal. The adrenal labs are normal. The only test pending is the cortisol test which needs to be checked in the morning, fasting, after taking dexamethasone the night before, just that she did in the past.

## 2023-03-14 ENCOUNTER — Telehealth: Payer: Self-pay

## 2023-03-14 NOTE — Telephone Encounter (Signed)
-----   Message from Carlus Pavlov sent at 03/13/2023  4:04 PM EDT ----- Can you please call pt.:  Lipids at goal with exception of the low HDL (good cholesterol) Her urine proteins are normal. Her kidney function is normal. The adrenal labs are normal. The only test pending is the cortisol test which needs to be checked in the morning, fasting, after taking dexamethasone the night before, just that she did in the past.

## 2023-03-14 NOTE — Telephone Encounter (Signed)
Patient attempted with interpreter Christiane Ha id# [phone] to give reults of recent labs as directed by MD. Will retry at another date, no answer on either number listed

## 2023-07-03 ENCOUNTER — Ambulatory Visit: Payer: Medicare Other | Admitting: Internal Medicine
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
## 2023-08-14 ENCOUNTER — Ambulatory Visit: Payer: Medicare Other | Admitting: Student

## 2023-08-14 ENCOUNTER — Encounter: Payer: Self-pay | Admitting: Student

## 2023-08-14 VITALS — BP 131/75 | HR 70 | Temp 97.8°F | Ht 73.0 in | Wt 284.3 lb

## 2023-08-14 DIAGNOSIS — E119 Type 2 diabetes mellitus without complications: Secondary | ICD-10-CM | POA: Diagnosis not present

## 2023-08-14 DIAGNOSIS — F32A Depression, unspecified: Secondary | ICD-10-CM | POA: Diagnosis not present

## 2023-08-14 DIAGNOSIS — Z7984 Long term (current) use of oral hypoglycemic drugs: Secondary | ICD-10-CM | POA: Diagnosis not present

## 2023-08-14 DIAGNOSIS — F325 Major depressive disorder, single episode, in full remission: Secondary | ICD-10-CM

## 2023-08-14 LAB — POCT GLYCOSYLATED HEMOGLOBIN (HGB A1C): Hemoglobin A1C: 11.5 % — AB (ref 4.0–5.6)

## 2023-08-14 LAB — GLUCOSE, CAPILLARY: Glucose-Capillary: 361 mg/dL — ABNORMAL HIGH (ref 70–99)

## 2023-08-14 MED ORDER — RYBELSUS 7 MG PO TABS
7.0000 mg | ORAL_TABLET | Freq: Every day | ORAL | 1 refills | Status: DC
Start: 2023-09-14 — End: 2023-09-04

## 2023-08-14 MED ORDER — RYBELSUS 3 MG PO TABS: 3.0000 mg | ORAL_TABLET | Freq: Every day | ORAL | 0 refills | Status: DC

## 2023-08-14 NOTE — Patient Instructions (Addendum)
 Thank you, [name] for allowing [place] to provide your care today. Today we discussed your blood sugar.I am sorry you are having issues with your endocrinologist.    I am adding Rybelsus to your medication regimen.  Please take 3 mg daily for the next 30 days.  And take 7 mg daily after  that. I will see you again in 4 to 6 weeks.  Lab Results  Component Value Date   HGBA1C 11.5 (A) 08/14/2023   HGBA1C 11.1 (A) 03/03/2023   HGBA1C 9.8 (A) 06/13/2022     I have ordered the following labs for you:  Lab Orders         Glucose, capillary         POC Hbg A1C       Tests ordered today:    Referrals ordered today:   Referral Orders         Referral to Nutrition and Diabetes Services       I have ordered the following medication/changed the following medications:   Stop the following medications: There are no discontinued medications.   Start the following medications: Meds ordered this encounter  Medications   Semaglutide (RYBELSUS) 3 MG TABS    Sig: Take 1 tablet (3 mg total) by mouth daily.    Dispense:  30 tablet    Refill:  0   Semaglutide (RYBELSUS) 7 MG TABS    Sig: Take 1 tablet (7 mg total) by mouth daily.    Dispense:  30 tablet    Refill:  1    Dispense after 09/09/2022     Follow up: 3 months    Should you have any questions or concerns please call the internal medicine clinic at [phone].    Kathleen Lime, M.D [facility]

## 2023-08-14 NOTE — Assessment & Plan Note (Signed)
 Chronically stable on sertraline 100 mg daily.  Reports her mood is been great.  Denies any suicidal ideation at this time. - Continue sertraline 100 mg daily
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
## 2023-08-14 NOTE — Assessment & Plan Note (Addendum)
 Lab Results  Component Value Date   HGBA1C 11.5 (A) 08/14/2023   HGBA1C 11.1 (A) 03/03/2023   HGBA1C 9.8 (A) 06/13/2022  This patient presented to me in the office for her diabetes follow-up.  She was following up with an endocrinologist  but expressed dissatisfaction with the services that she gets and does not want to go back to her endocrinologist at this time.  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  She is currently taking metformin and glipizide for her diabetes.  She was initially taking Trulicity but stopped taking it because she could not afford the co-pay anymore.  Her endocrinologist switched her to insulin Lantus 25 units daily but she reports it is hard for her to inject her insulin because of her legal blindness state.  Patient reports she lives alone and it's hard for her to locate needles to inject.  During my discussions with the patient, she seems to be more comfortable and adherent to medications in pill form .  In the setting of her legal blindness and uncontrolled diabetes, I think it is reasonable to find a common ground that would help control her diabetes and also make her more adherent to the regimen.  I think Rybelsus would be appropriate as an adjunct medication versus insulin.  Upon discussions with the patient ,she agrees to try Rybelsus at this time.  I think patient will also benefit from an SGLT inhibitor but I will defer this discussions for her next visit to make one change at a time.  Because of her glipizide, I worry that she might have hypoglycemic episodes.  Please consider discussions on frequent blood glucose checks at home to gather enough data to further modify her glipizide dose if needed at that time. -Prescribe Rybelsus 3 mg daily for 30 days and titrate up to 7 mg daily afterwards -Continue metformin 1000 mg twice daily -Continue glipizide 10mg  daily with breakfast -Refer to diabetes coordinator  Glorious Peach

## 2023-08-15 NOTE — Progress Notes (Signed)
 Internal Medicine Clinic Attending  Case discussed with the resident at the time of the visit.  We reviewed the resident's history and exam and pertinent patient test results.  I agree with the assessment, diagnosis, and plan of care documented in the resident's note.    Tough situation - patient is blind, so is unable to safely do injections or check blood glucose levels We will optimize all PO options first Agree with adding Rybelsus to her current regimen since she's not taking Trulicity.

## 2023-08-15 NOTE — Addendum Note (Signed)
 Addended by: Derrek Monaco on: 08/15/2023 09:11 AM   Modules accepted: Orders

## 2023-08-18 ENCOUNTER — Telehealth: Payer: Self-pay | Admitting: Student

## 2023-08-18 NOTE — Telephone Encounter (Signed)
 Copied from CRM [id number]. Topic: Clinical - Prescription Issue >> Aug 18, 2023 11:37 AM Hamdi H wrote: Reason for CRM: She wants her provider to add her to the [facility] [place] program prescription with the [facility]. She has problems with the cost for her diabetes medication She states she is totally blind and needs help paying for her prescriptions. Please call pt back at mobile number with more information.

## 2023-09-04 ENCOUNTER — Telehealth: Payer: Self-pay | Admitting: Dietician

## 2023-09-04 ENCOUNTER — Encounter: Payer: Self-pay | Admitting: Student

## 2023-09-04 ENCOUNTER — Ambulatory Visit: Admitting: Student

## 2023-09-04 ENCOUNTER — Ambulatory Visit: Admitting: Dietician

## 2023-09-04 ENCOUNTER — Other Ambulatory Visit (HOSPITAL_COMMUNITY): Payer: Self-pay

## 2023-09-04 VITALS — BP 138/69 | HR 74 | Temp 97.5°F | Ht 73.0 in | Wt 280.8 lb

## 2023-09-04 DIAGNOSIS — H543 Unqualified visual loss, both eyes: Secondary | ICD-10-CM

## 2023-09-04 DIAGNOSIS — I10 Essential (primary) hypertension: Secondary | ICD-10-CM | POA: Diagnosis not present

## 2023-09-04 DIAGNOSIS — K439 Ventral hernia without obstruction or gangrene: Secondary | ICD-10-CM | POA: Diagnosis not present

## 2023-09-04 DIAGNOSIS — Z1211 Encounter for screening for malignant neoplasm of colon: Secondary | ICD-10-CM

## 2023-09-04 DIAGNOSIS — E119 Type 2 diabetes mellitus without complications: Secondary | ICD-10-CM

## 2023-09-04 DIAGNOSIS — Z7984 Long term (current) use of oral hypoglycemic drugs: Secondary | ICD-10-CM

## 2023-09-04 MED ORDER — RYBELSUS 7 MG PO TABS: 7.0000 mg | ORAL_TABLET | ORAL | 5 refills | Status: DC

## 2023-09-04 MED ORDER — RYBELSUS 7 MG PO TABS
7.0000 mg | ORAL_TABLET | ORAL | 1 refills | Status: DC
Start: 2023-09-14 — End: 2023-09-04
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
## 2023-09-04 NOTE — Assessment & Plan Note (Signed)
 Today is a 3-week follow-up for her type 2 diabetes.  Her most recent A1c was 11.5 3 weeks ago.  History of challenging to treat diabetes due to her legal blindness and financial constraints.  She was previously with an endocrinologist but was dissatisfied with the care.  Earlier this month, she was started on Rybelsus 3, as it pill was found to be the easiest method of treatment for her given her blindness, better than injections and certainly better than insulin.  Unfortunately the 3 mg pill was too expensive and she did not pick it up.  For some reason the 7 mg pill is more economical. - Continue glipizide 10 mg daily - Continue metformin 1000 twice daily - Start Rybelsus 7 mg every other day - She will see Lupita Leash as well today - RTC in 1 month
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
## 2023-09-04 NOTE — Patient Instructions (Addendum)
 Please follow up in 2 weeks with me.  Download the [facility] G7 app and create an account.  Then when you come to see me, we can put on a sensor on your arm.  As for Trulicity since you said that worked well for you and you liked it, pharmacy tech says  " month supply of trulicity copay is $47... [employer] isnt doing new enrollments without certain qualifications. She should be able to call her insurance to enroll in an affordable payment plan. This will help until her deductible is reached. "  You need to speak with the doctor about starting you back on TRULICITY if you can afford.   Samantha Petersen [phone]

## 2023-09-04 NOTE — Telephone Encounter (Signed)
 Gave ads Samantha Petersen' information to assist her with obtaining a Prodigy voice meter and supplies. They will be faxing [place] an order.

## 2023-09-04 NOTE — Patient Instructions (Signed)
 Start Rybelsus 7mg  every other day.  At next visit, we will probably change the medicine to daily.

## 2023-09-04 NOTE — Assessment & Plan Note (Signed)
 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  It is not changing.  At some point in the past she was told that had to be 2 inches for to be repaired, but I do not believe that is true. - Refer to general surgery. - No signs of strangulation

## 2023-09-05 NOTE — Assessment & Plan Note (Signed)
 Near goal, 138/69. -Continue lisinopril-hydrochlorothiazide 10-12.5 daily

## 2023-09-05 NOTE — Assessment & Plan Note (Signed)
 This impacts medication choices. Avoiding insulin due to injection difficulties. Weekly injections are manageable.

## 2023-09-09 NOTE — Telephone Encounter (Signed)
 Notified Samantha Petersen of order for meter and supplies to [employer] mail order company. Assessed order and put in [facility] team's box for signature.

## 2023-09-15 NOTE — Telephone Encounter (Signed)
 Copied from CRM [id number]. Topic: Appointments - Scheduling Inquiry for Clinic >> Sep 12, 2023  2:13 PM Tiffany H wrote: Reason for CRM: Patient called to reschedule her April appointments to May 12 and May 27th. Transportation guidelines were linked to both appointments. Please check to see if patient will need to initiate rescheduling her transportation.   Please assist.   Pt is to check with her [employer] ins to f/u with her transportation .

## 2023-09-18 ENCOUNTER — Encounter: Payer: Self-pay | Admitting: Internal Medicine

## 2023-09-18 ENCOUNTER — Encounter: Admitting: Dietician

## 2023-09-18 NOTE — Progress Notes (Signed)
 Internal Medicine Clinic Attending  Case discussed with the resident at the time of the visit.  We reviewed the resident's history and exam and pertinent patient test results.  I agree with the assessment, diagnosis, and plan of care documented in the resident's note.

## 2023-09-23 ENCOUNTER — Encounter: Admitting: Dietician

## 2023-10-02 ENCOUNTER — Encounter: Admitting: Student

## 2023-10-03 ENCOUNTER — Encounter: Payer: Self-pay | Admitting: Internal Medicine

## 2023-10-20 ENCOUNTER — Encounter: Admitting: Dietician

## 2023-11-04 ENCOUNTER — Encounter: Admitting: Student

## 2023-11-04 ENCOUNTER — Telehealth: Payer: Self-pay

## 2023-11-04 NOTE — Telephone Encounter (Signed)
 Unable to reach patient to resch an appt that she missed 11/04/2023. Unable to LVM, if pt call back please sch her appt.

## 2023-11-12 ENCOUNTER — Other Ambulatory Visit: Payer: Self-pay | Admitting: Internal Medicine

## 2023-11-30 ENCOUNTER — Other Ambulatory Visit: Payer: Self-pay | Admitting: Student

## 2023-11-30 DIAGNOSIS — F419 Anxiety disorder, unspecified: Secondary | ICD-10-CM

## 2023-11-30 DIAGNOSIS — F32A Depression, unspecified: Secondary | ICD-10-CM

## 2023-12-01 NOTE — Telephone Encounter (Signed)
 Medication sent to pharmacy

## 2024-02-07 ENCOUNTER — Other Ambulatory Visit: Payer: Self-pay | Admitting: Student

## 2024-02-07 ENCOUNTER — Other Ambulatory Visit: Payer: Self-pay | Admitting: Internal Medicine

## 2024-02-07 DIAGNOSIS — I10 Essential (primary) hypertension: Secondary | ICD-10-CM

## 2024-02-07 DIAGNOSIS — E119 Type 2 diabetes mellitus without complications: Secondary | ICD-10-CM

## 2024-02-10 ENCOUNTER — Other Ambulatory Visit: Payer: Self-pay | Admitting: Student

## 2024-02-10 DIAGNOSIS — F32A Depression, unspecified: Secondary | ICD-10-CM

## 2024-02-10 DIAGNOSIS — I10 Essential (primary) hypertension: Secondary | ICD-10-CM

## 2024-02-10 DIAGNOSIS — E119 Type 2 diabetes mellitus without complications: Secondary | ICD-10-CM

## 2024-02-10 DIAGNOSIS — F419 Anxiety disorder, unspecified: Secondary | ICD-10-CM

## 2024-02-10 NOTE — Telephone Encounter (Signed)
 Medication sent to pharmacy

## 2024-02-10 NOTE — Telephone Encounter (Unsigned)
 Copied from CRM #[id number]. Topic: Clinical - Prescription Issue >> Feb 10, 2024  4:36 PM Vivian Z wrote: Reason for CRM: Patient is requesting to transfer lisinopril -hydrochlorothiazide  (ZESTORETIC ) 10-12.5 MG tablet to [facility]. Patient is also requesting bridge prescriptions for the medications below until her appointment on 02/27/23.  metFORMIN  (GLUCOPHAGE -XR) 500 MG 24 hr tablet  glipiZIDE  (GLUCOTROL  XL) 5 MG 24 hr tablet atorvastatin  (LIPITOR) 20 MG tablet sertraline  (ZOLOFT ) 100 MG tablet  [id number] [place], [place] - [address] [facility] [address] [place] [address] Phone: [phone] Fax: [phone]

## 2024-02-11 MED ORDER — ATORVASTATIN CALCIUM 20 MG PO TABS
20.0000 mg | ORAL_TABLET | Freq: Every day | ORAL | 0 refills | Status: AC
  Filled 2024-05-05: qty 30, 30d supply, fill #0

## 2024-02-11 MED ORDER — GLIPIZIDE ER 5 MG PO TB24: 10.0000 mg | ORAL_TABLET | Freq: Every day | ORAL | 0 refills | Status: DC

## 2024-02-11 MED ORDER — SERTRALINE HCL 100 MG PO TABS: 100.0000 mg | ORAL_TABLET | Freq: Every day | ORAL | 0 refills | Status: DC

## 2024-02-11 MED ORDER — LISINOPRIL-HYDROCHLOROTHIAZIDE 10-12.5 MG PO TABS: 1.0000 | ORAL_TABLET | Freq: Every day | ORAL | 0 refills | Status: DC

## 2024-02-11 MED ORDER — METFORMIN HCL ER 500 MG PO TB24
1000.0000 mg | ORAL_TABLET | Freq: Two times a day (BID) | ORAL | 0 refills | Status: DC
Start: 2024-02-11 — End: 2024-04-23

## 2024-02-11 NOTE — Telephone Encounter (Signed)
 Pharmacy is requesting a refill, pt has not been seen since 2024. Per last note pt was supposed to return in 3-4 months from last visit.

## 2024-02-13 NOTE — Telephone Encounter (Signed)
 PCP refill Metfromin instead on 02/11/24

## 2024-02-13 NOTE — Telephone Encounter (Signed)
 Pharmacy is requesting a refill for Metfromin which is attached to this message already.

## 2024-02-27 ENCOUNTER — Ambulatory Visit: Payer: Self-pay | Admitting: Student

## 2024-04-22 NOTE — Progress Notes (Unsigned)
   Established Patient Office Visit  Subjective   Patient ID: Samantha Petersen, female    DOB: 1975/08/28  Age: 48 y.o. MRN: [id number]  No chief complaint on file.   Samantha Petersen is a 48 years old female with a past medical history of type 2 diabetes mellitus, hypertension, depression and anxiety, and bilateral blindness who presents to the clinic today for follow up on her diabetes. Please see problem-based assessment and plan below for details.     ROS    Objective:    There were no vitals taken for this visit. Physical Exam   No results found for any visits on 04/23/24.  The ASCVD Risk score (Arnett DK, et al., 2019) failed to calculate for the following reasons:   The valid total cholesterol range is 130 to 320 mg/dL    Assessment & Plan:   Patient seen with {IMTSattending2025/2026:32924}.  Problem List Items Addressed This Visit       Endocrine   Type 2 diabetes mellitus (HCC) - Primary    DM- Last A1c was 11.5% in 09/05/2023. Oral hypoglycemic medicines are easier for the patient than injectables due to legal blindness. Previously was to start Rybelsus  3mg , but the 3mg  pill was too expensive and she did not pick it up.  For some reason the 7 mg pill is more economical. - Continue glipizide  10 mg daily - Continue metformin  1000 twice daily - Start Rybelsus  7 mg every other day and can increase to daily at next visit - She will see Arland as well today who is giving her a CGM - She reports a willingness to return to GLP1 injections in the future - RTC in 1 month   Depression -sertaline 100mg  daily  HLD- atorvastatin  20mg  daily No follow-ups on file.    Jebidiah Baggerly, DO Internal Medicine Resident, PGY-1 11:00 AM 04/22/2024

## 2024-04-23 ENCOUNTER — Ambulatory Visit (INDEPENDENT_AMBULATORY_CARE_PROVIDER_SITE_OTHER)

## 2024-04-23 VITALS — BP 154/81 | HR 88 | Temp 98.0°F | Ht 73.0 in | Wt 265.0 lb

## 2024-04-23 DIAGNOSIS — G8929 Other chronic pain: Secondary | ICD-10-CM

## 2024-04-23 DIAGNOSIS — Z79899 Other long term (current) drug therapy: Secondary | ICD-10-CM

## 2024-04-23 DIAGNOSIS — E119 Type 2 diabetes mellitus without complications: Secondary | ICD-10-CM | POA: Diagnosis not present

## 2024-04-23 DIAGNOSIS — I10 Essential (primary) hypertension: Secondary | ICD-10-CM | POA: Diagnosis not present

## 2024-04-23 DIAGNOSIS — F325 Major depressive disorder, single episode, in full remission: Secondary | ICD-10-CM

## 2024-04-23 DIAGNOSIS — E785 Hyperlipidemia, unspecified: Secondary | ICD-10-CM

## 2024-04-23 DIAGNOSIS — M25562 Pain in left knee: Secondary | ICD-10-CM

## 2024-04-23 DIAGNOSIS — Z7985 Long-term (current) use of injectable non-insulin antidiabetic drugs: Secondary | ICD-10-CM

## 2024-04-23 DIAGNOSIS — F17210 Nicotine dependence, cigarettes, uncomplicated: Secondary | ICD-10-CM | POA: Diagnosis not present

## 2024-04-23 DIAGNOSIS — Z7984 Long term (current) use of oral hypoglycemic drugs: Secondary | ICD-10-CM

## 2024-04-23 DIAGNOSIS — M25561 Pain in right knee: Secondary | ICD-10-CM

## 2024-04-23 DIAGNOSIS — Z8249 Family history of ischemic heart disease and other diseases of the circulatory system: Secondary | ICD-10-CM

## 2024-04-23 LAB — GLUCOSE, CAPILLARY: Glucose-Capillary: 260 mg/dL — ABNORMAL HIGH (ref 70–99)

## 2024-04-23 LAB — POCT GLYCOSYLATED HEMOGLOBIN (HGB A1C): HbA1c, POC (controlled diabetic range): 11 % — AB (ref 0.0–7.0)

## 2024-04-23 MED ORDER — TRULICITY 0.75 MG/0.5ML ~~LOC~~ SOAJ
0.7500 mg | SUBCUTANEOUS | 0 refills | Status: DC
  Filled 2024-05-05 – 2024-05-14 (×2): qty 2, 28d supply, fill #0

## 2024-04-23 MED ORDER — METFORMIN HCL ER 500 MG PO TB24
1000.0000 mg | ORAL_TABLET | Freq: Two times a day (BID) | ORAL | 0 refills | Status: AC
  Filled 2024-05-05: qty 120, 30d supply, fill #0

## 2024-04-23 MED ORDER — LISINOPRIL-HYDROCHLOROTHIAZIDE 10-12.5 MG PO TABS
1.0000 | ORAL_TABLET | Freq: Every day | ORAL | 11 refills | Status: DC
  Filled 2024-05-05 – 2024-05-14 (×2): qty 30, 30d supply, fill #0

## 2024-04-23 MED ORDER — IBUPROFEN 800 MG PO TABS
800.0000 mg | ORAL_TABLET | Freq: Three times a day (TID) | ORAL | 0 refills | Status: DC | PRN
  Filled 2024-05-14: qty 30, 10d supply, fill #0

## 2024-04-23 NOTE — Assessment & Plan Note (Signed)
 BP today 154/81. Patient had stopped all medications in August 2025 because she describes having felt unwell and no longer wanted to be on medication. After discussion and shared-decision making, patient is agreeable to restart her blood pressure medication. She does not check her BP at home due to her legal blindness. Denies headaches, SOB, chest pain, or neck stiffness today.   -Will restart Lisinopril -hydrochlorothiazide  10-12.5.mg. -Monitor BP closely at next visit to ensure improvement. If still SBP >130, consider uptitrating on dose first before adding additional pills to ensure patient adherence. -Checking CMP today, consider rechecking BMP at 1 month follow up to assess renal function

## 2024-04-23 NOTE — Assessment & Plan Note (Addendum)
 During her visit today, patient reports she has stopped taking all her medication (for all her chronic conditions) due to low energy and no longer wanting to take as many pills as she does. Had an extensive conversation with her today about her diabetes -- how it is a progressive disease and despite her feeling well today, she may feel worse in the future at an indeterminate amount of time because diabetes affects the whole body, especially the heart and kidneys. Educated patient that her A1c at her last visit in March was 11.5% and today is 11.0%. While this is an improvement, it is still far from goal for where we would like her control to be. Emphasized quality and longevity of life with managing diabetes, and that may mean being aggressive with medications in the short term to lower her A1c, but may be able to manage her diabetes with fewer hypoglycemic agents in the long-term. Patient voiced understanding and states her disease process makes sense in this way, but it is hard for her to take certain medications given her blindness. She agreed to meet in the middle with 2 hypoglycemic agents -- Trulicity  weekly and Metformin  1000mg  BID-- to lower her A1c.   She states she has also been having trouble with Trulicity  being covered by her insurance. Informed her that we can work with her if this is the case to do a PA, or switch to a different GLP1 to help manage. She would like to avoid insulins, as she cannot easily titrate the kwikpens (due to her vision) and has to ask a neighbor for help, which isn't always feasible (she lives alone). Weekly injectables are fine for her, and reducing pill burden seems to help her adherence as well.   -A1c, CMP today -Restart Trulicity  0.75mg  weekly -Restart Metformin  1000mg  BID -Patient to follow up in 2 weeks to assess symptoms or side effects, also to ensure her GLP1 is covered by insurance --if Trulicity  is not covered by her insurance, consider switching to  Ozempic  -Patient was to have uACR done today, however left before leaving a sample. Ensure uACR is obtained at next visit in 2 weeks -A1c in 3 months

## 2024-04-23 NOTE — Assessment & Plan Note (Addendum)
 Was not specified at this visit due to education and shared-decision making, however patient was previously taking Atorvastatin  20mg  daily. She stopped all her medications 3 months ago on her own volition. To ensure adherence to her DM and HTN management, we started by adding back these medications first. At her next visit, please consider discussing the importance of cholesterol management in relation to CV protection and stroke risk. Last lipid panel in 02/2023 show LDL 24 and total cholesterol 72.  -At next visit, check lipid panel and discuss restarting Crestor 10mg .

## 2024-04-23 NOTE — Progress Notes (Signed)
 Internal Medicine Clinic Attending  I was physically present during the key portions of the resident provided service and participated in the medical decision making of patient's management care. I reviewed pertinent patient test results.  The assessment, diagnosis, and plan were formulated together and I agree with the documentation in the resident's note.  Shawn Sick, MD

## 2024-04-23 NOTE — Assessment & Plan Note (Signed)
 Stopped sertaline 100mg  3 months ago. States that when she was on all her medication, her mood was down and that she felt a lack of energy. She wasn't sleeping well, to the point she would get so tired at work that she'd sometimes fall asleep. She notes since stopping her meds, she now has energy and is happy, and attributes a lot of this to her changes in her life. This includes the two little dogs she has at home and getting out of the house to walk and do activities.   We did discuss if she felt her mood was stable without medication. As of today, she states yes, but inquires if there is a way to take a depression medication on a PRN basis. Discussed that there unfortunately no good as-needed medication for depression, but are options for anxiety. Patient opts to not restart any SSRI/SNRI for now, but will let us  know how her mood is through the next few visits.

## 2024-04-23 NOTE — Assessment & Plan Note (Signed)
 Chronic, ongoing. Worsens in the colder winter months. Patient has to take stairs at her place of residence/work and notes it is becoming more painful to bend. Still maintains full ROM, but has internal pain that she describes as deep in the bone. Typically, she will use Tylenol  or ibuprofen  and this seems to help, however will need between 2-4 pills throughout the day to make the pain dissolve. She is asking for a short course of ibuprofen  800mg  during this next month to help with pain and pill burden so she can better ambulate and take stairs.  -Will send Ibuprofen  800mg  q8hr PRN for 1 month. If patient follows up and notes this is beneficial, consider the need for additional refill if no renal dysfxn or GI side effects.

## 2024-04-24 LAB — CMP14 + ANION GAP
ALT: 30 IU/L (ref 0–32)
AST: 31 IU/L (ref 0–40)
Albumin: 3.9 g/dL (ref 3.9–4.9)
Alkaline Phosphatase: 139 IU/L — ABNORMAL HIGH (ref 41–116)
Anion Gap: 19 mmol/L — ABNORMAL HIGH (ref 10.0–18.0)
BUN/Creatinine Ratio: 11 (ref 9–23)
BUN: 5 mg/dL — ABNORMAL LOW (ref 6–24)
Bilirubin Total: 0.4 mg/dL (ref 0.0–1.2)
CO2: 18 mmol/L — ABNORMAL LOW (ref 20–29)
Calcium: 8.8 mg/dL (ref 8.7–10.2)
Chloride: 98 mmol/L (ref 96–106)
Creatinine, Ser: 0.46 mg/dL — ABNORMAL LOW (ref 0.57–1.00)
Globulin, Total: 2.9 g/dL (ref 1.5–4.5)
Glucose: 253 mg/dL — ABNORMAL HIGH (ref 70–99)
Potassium: 4 mmol/L (ref 3.5–5.2)
Sodium: 135 mmol/L (ref 134–144)
Total Protein: 6.8 g/dL (ref 6.0–8.5)
eGFR: 119 mL/min/1.73 (ref >59)

## 2024-04-26 ENCOUNTER — Telehealth: Payer: Self-pay | Admitting: *Deleted

## 2024-04-26 NOTE — Telephone Encounter (Signed)
 Copied from CRM #[id number]. Topic: Clinical - Prescription Issue >> Apr 26, 2024  4:15 PM DeAngela L wrote: Reason for CRM: Trulicity  medication was not approved for the patient to pick up at the pharmacy maybe a medication like trulicity  so this could be approved and more affordable, and also changed to the [facility] and also cause they have a delivery for the patient is more convenient   Patient would like to fill out for the [facility] delivery application also   and also she states the price for the metformin  and blood pressure medication along with the ibuprofen  was $80 to expensive [facility] and she would like to ask if her doctor can change the medication to the [facility] and maybe this could be more affordable   Pt num [phone] this is the patients roommate phone Glendia can call the patient back and leave a message for her to call

## 2024-04-28 ENCOUNTER — Other Ambulatory Visit (HOSPITAL_COMMUNITY): Payer: Self-pay

## 2024-04-28 ENCOUNTER — Telehealth: Payer: Self-pay

## 2024-04-28 NOTE — Telephone Encounter (Signed)
 Randell Lira (Key: [id number]) PA Case ID #: [id number] Need Help? Call us  at [phone] Outcome Additional Information Required This medication or product is on your plan's list of covered drugs. Prior authorization is not required at this time. If your pharmacy has questions regarding the processing of your prescription, please have them call the [facility] pharmacy help desk at [phone]. **Please note: This request was submitted electronically. Formulary lowering, tiering exception, cost reduction and/or pre-benefit determination review (including prospective Medicare hospice reviews) requests cannot be requested using this method of submission. Providers contact us  at [phone] for further assistance. Drug Trulicity  0.75MG /0.5ML auto-injectors ePA cloud logo Form [facility] Medicare Part D Electronic Prior Authorization Form [phone] NCPDP)

## 2024-04-28 NOTE — Telephone Encounter (Signed)
 [facility]  will cost $205.00 for the pt.

## 2024-04-29 ENCOUNTER — Telehealth: Payer: Self-pay

## 2024-04-29 ENCOUNTER — Ambulatory Visit: Payer: Self-pay

## 2024-04-29 NOTE — Telephone Encounter (Signed)
 Consulted by Dr. Isobel to address cost issues with Trulicity . Per test claim, the high cost of Trulicity  is due to her deductible and copay - total of $205.07 is due to $158.07 remaining on her deductible + $47 copay. After her deductible, copay will be $47 per month, but of note deductible will restart in January.   Attempted to contact patient to explain this cost. Attempted three times without success. Unable to leave VM.   Will continue to outreach patient. Will screen income for eligibility for Medicare LIS. For 2025, the yearly income limits are less than $23,472 for an individual, with resource limits of $17,600 for an individual. If approved, cost of brand medications would be lowered to $12.15 per month.  Discussed patient's potential eligibility in LIBERATE study with Dr. Isobel, who advised that this would like be challenging with the patient's vision impairment and hesitancy to take medications and monitor her sugars.  Lorain Baseman, PharmD [facility] [phone]

## 2024-04-29 NOTE — Progress Notes (Signed)
 CMP with decreased BUN/Cr and elevated ALP compared to 1 year ago. Pt previously on sertaline and abruptly ceased adherence to all meds. Will continue to trend and monitor closely.

## 2024-04-30 ENCOUNTER — Other Ambulatory Visit: Payer: Self-pay

## 2024-04-30 ENCOUNTER — Ambulatory Visit: Payer: Self-pay

## 2024-04-30 NOTE — Telephone Encounter (Signed)
 RTC to patient through [facility] [id number].  Patient states that she is beginning to feel better .  Thinks she may have eaten something that upset her stomach and made her feel bad.  Patient also stated that she has not been able to take any of her medications.  Has a high co-pay.  Said that the doctor told her about a program at the [facility] that could help.  Told patient that she will need to transfer her prescriptions  to a [facility] to get in the program.  Patient is ok with that.   Call to the [facility] to have patient's prescriptions transferred to a [facility].  Spoke to a Pharmacist who will have the patient's medications transferred to the [facility] for patient delivery.  Asked if they can do the Diabetes medications as well as the blood pressure medications for now and will get refills on other meds sent if needed. Patient information is on their file.  Medications will be delivered probably by Monday.  Plan to call patient today and also prior to delivery.

## 2024-04-30 NOTE — Telephone Encounter (Signed)
 Triaged with Spanish interpreter assistance   FYI Only or Action Required?: Action required by provider: medication assistance, refusing UC at this time.  Patient was last seen in primary care on 04/23/2024 by Amilibia, Jaden, DO.  Called Nurse Triage reporting Dizziness.  Symptoms began today.  Interventions attempted: Rest, hydration, or home remedies.  Symptoms are: unchanged.  Triage Disposition: See HCP Within 4 Hours (Or PCP Triage)  Patient/caregiver understands and will follow disposition?: No, wishes to speak with PCP   Copied from CRM #[id number]. Topic: Clinical - Red Word Triage >> Apr 30, 2024 11:26 AM DeAngela L wrote: Red Word that prompted transfer to Nurse Triage: patient is calling cause she believes her blood pressure is elevated  The patient states upset stomach started last night after eating she was very sick and not feeling well and feels like there is no blood running through her body   But now this morning she feeling headache and dizziness this morning anytime she stands up or sit down the patient took ibuprofen  800 5am  and tylenol  this morning  Pt num [phone] (H) Reason for Disposition  [1] Dizziness caused by heat exposure, sudden standing, or poor fluid intake AND [2] no improvement after 2 hours of rest and fluids  Answer Assessment - Initial Assessment Questions Financial issue with metformin  and trulicity , unable to afford. She is asking if pcp can change medication to something more affordable. She is working with pharmacy for assistance program and pending call back. She is still asking for pcp assistance. $494.00, plus $500.00  Has not been on medication for blood pressure and blood sugar for 3 months.   Does not have device to measure blood sugar or blood pressure. She is blind and cannot use.   No appointments are available, advised urgent care, she states she cannot go for 24 hours due to transport. She may consider going tomorrow.  Discussed ER precautions, she is agreeable to go to ER if symptoms worsening.    1. DESCRIPTION: Describe your dizziness.     lightheaded 2. LIGHTHEADED: Do you feel lightheaded? (e.g., somewhat faint, woozy, weak upon standing)     Dizzy upon standing  3. VERTIGO: Do you feel like either you or the room is spinning or tilting? (i.e., vertigo)     denies 4. SEVERITY: How bad is it?  Do you feel like you are going to faint? Can you stand and walk?     Feels faint  5. ONSET:  When did the dizziness begin?     today 6. AGGRAVATING FACTORS: Does anything make it worse? (e.g., standing, change in head position)     standing 7. HEART RATE: Can you tell me your heart rate? How many beats in 15 seconds?  (Note: Not all patients can do this.)        8. CAUSE: What do you think is causing the dizziness? (e.g., decreased fluids or food, diarrhea, emotional distress, heat exposure, new medicine, sudden standing, vomiting; unknown)     Sudden standing 9. RECURRENT SYMPTOM: Have you had dizziness before? If Yes, ask: When was the last time? What happened that time?      10. OTHER SYMPTOMS: Do you have any other symptoms? (e.g., fever, chest pain, vomiting, diarrhea, bleeding)      Headache, weakness.  Protocols used: Dizziness - Lightheadedness-A-AH

## 2024-05-03 ENCOUNTER — Other Ambulatory Visit (HOSPITAL_COMMUNITY): Payer: Self-pay

## 2024-05-03 ENCOUNTER — Telehealth: Payer: Self-pay | Admitting: Student

## 2024-05-03 NOTE — Telephone Encounter (Signed)
 Please refer to message below.  Copied from CRM [id number]. Topic: General - Other >> Apr 30, 2024 11:15 AM Samantha Petersen wrote: Reason for CRM: patient returning call to ask about help with paying for Trulicity  and Metformin  cause the deductible was very expensive for the patient   Informed the patient the Pharmacist [facility] was trying to reach her  The patient is home from work today so the Pharmacist can call the patient today   Pt num [phone] (H)  The patient would like to find a delivery pharmacy and an interpreter when she speaks with someone is helpful

## 2024-05-04 ENCOUNTER — Other Ambulatory Visit (HOSPITAL_COMMUNITY): Payer: Self-pay

## 2024-05-04 NOTE — Telephone Encounter (Signed)
 Call to [facility].  Pharmacist stated that they just [facility] and are currently working on for patient.

## 2024-05-04 NOTE — Telephone Encounter (Signed)
 Great.  I think when she realizes it is a 1 time payment she will be ok.

## 2024-05-04 NOTE — Telephone Encounter (Signed)
 Reattempted outreach to discuss medication cost. Continued to get error message that patient is not available. Unable to leave VM. Will reattempt after 05/10/24.   Lorain Baseman, PharmD [facility] [phone]

## 2024-05-05 ENCOUNTER — Other Ambulatory Visit (HOSPITAL_COMMUNITY): Payer: Self-pay

## 2024-05-05 ENCOUNTER — Other Ambulatory Visit: Payer: Self-pay

## 2024-05-05 MED ORDER — SERTRALINE HCL 100 MG PO TABS
100.0000 mg | ORAL_TABLET | Freq: Every day | ORAL | 0 refills | Status: DC
  Filled 2024-05-05 – 2024-05-14 (×2): qty 30, 30d supply, fill #0

## 2024-05-05 MED ORDER — RYBELSUS 7 MG PO TABS
7.0000 mg | ORAL_TABLET | ORAL | 0 refills | Status: DC
  Filled 2024-05-05: qty 30, 60d supply, fill #0

## 2024-05-05 MED ORDER — METFORMIN HCL 1000 MG PO TABS
1000.0000 mg | ORAL_TABLET | Freq: Two times a day (BID) | ORAL | 11 refills | Status: DC
  Filled 2024-05-05 – 2024-05-14 (×2): qty 60, 30d supply, fill #0

## 2024-05-05 MED ORDER — GLIPIZIDE ER 5 MG PO TB24
10.0000 mg | ORAL_TABLET | Freq: Every day | ORAL | 0 refills | Status: AC
  Filled 2024-05-05: qty 60, 30d supply, fill #0

## 2024-05-07 ENCOUNTER — Other Ambulatory Visit: Payer: Self-pay

## 2024-05-10 ENCOUNTER — Other Ambulatory Visit: Payer: Self-pay

## 2024-05-12 ENCOUNTER — Other Ambulatory Visit: Payer: Self-pay

## 2024-05-14 ENCOUNTER — Other Ambulatory Visit (HOSPITAL_COMMUNITY): Payer: Self-pay

## 2024-05-14 ENCOUNTER — Other Ambulatory Visit: Payer: Self-pay

## 2024-05-14 ENCOUNTER — Telehealth: Payer: Self-pay | Admitting: *Deleted

## 2024-05-14 NOTE — Telephone Encounter (Signed)
 I'll ask Dr Brinda for assistance.

## 2024-05-14 NOTE — Telephone Encounter (Signed)
 Copied from CRM #[id number]. Topic: Clinical - Prescription Issue >> May 14, 2024  8:46 AM Graeme ORN wrote: Reason for CRM: Patient called. Was prescribed Trulicity  and $239 for 4 shots. She is disabled and not able to afford this amount. She would like provider to prescribe something different. Changing to [facility] in January from [facility]. Please reach out to patient. Thank You

## 2024-05-14 NOTE — Telephone Encounter (Signed)
 Call to [facility].  Patient's meds are ready for delivery.  Have been trying to contact patient to get her oayment information so that the meds can be delivered.  Attempted to call patient on her home and cell numbers.  Unable to leave a message on either line through [employer]  # [phone]. Call to Patient's contact Samantha Petersen.  Message was left that the patient's medications are ready for delivery.  Need patient to contact the pharmacy with her payment information so that the claim can be filled.  Will deliver as soon as this information is given.  Left number for the patient to contact the [facility] at [facility].       Copied from CRM [id number]. Topic: Clinical - Prescription Issue >> May 14, 2024  8:29 AM Farrel B wrote: Reason for CRM: Pt is calling in regard to meds which were supposed to go to [facility] per the nurses notes on 11/21, she was needing the meds sent from [facility] previous pharmacy [facility] [address] to get the medications cheaper. Pt states she has been without her meds because she's been waiting for someone to call her back either from the clinic or pharmacy. She is visually impaired and medications were needing to be delivered to her. Please call [name] to assist. I attempted to call pharmacy and conference the pt in but called dropped, I tried calling her back 3 + times but the phone continued to ring and not able to leave VM. Please assist.  Contact Information Updating patient number [name] [phone]

## 2024-05-18 ENCOUNTER — Other Ambulatory Visit (HOSPITAL_COMMUNITY): Payer: Self-pay

## 2024-05-18 NOTE — Progress Notes (Signed)
 Re-attempted to contact patient x2 to discuss medication cost. Voicemail full, unable to leave message. Will attempt to outreach to screen for Medicare Extra Help or adjust regimen if Trulicity  continues to be unaffordable.   Lorain Baseman, PharmD [facility] [phone]

## 2024-05-21 ENCOUNTER — Ambulatory Visit

## 2024-06-07 ENCOUNTER — Ambulatory Visit: Admitting: Student
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
## 2024-06-11 ENCOUNTER — Telehealth: Payer: Self-pay | Admitting: *Deleted

## 2024-06-11 ENCOUNTER — Other Ambulatory Visit: Payer: Self-pay | Admitting: Student

## 2024-06-11 DIAGNOSIS — E119 Type 2 diabetes mellitus without complications: Secondary | ICD-10-CM

## 2024-06-11 MED ORDER — TRULICITY 0.75 MG/0.5ML ~~LOC~~ SOAJ: 0.7500 mg | SUBCUTANEOUS | 0 refills | Status: DC

## 2024-06-11 NOTE — Telephone Encounter (Signed)
 RTC to patient requests that due to her insurance and a lower cost would like for all of her medications to be sent to the [facility]. Copied from CRM [id number]. Topic: Clinical - Prescription Issue >> Jun 11, 2024  9:29 AM Susanna ORN wrote: Reason for CRM: Patient called in stating that she needs her provider to send her Dulaglutide  (TRULICITY ) 0.75 MG/0.5ML SOAJ to [facility] & also the doctor will need to sign it as well. She states they told her to have it faxed but she did not provide any information to the pharmacy. Please give patient a call back for further questions & concerns. CB #: [phone].

## 2024-06-11 NOTE — Progress Notes (Signed)
 Samantha Petersen                                          MRN: [id number]   06/11/2024   The VBCI Quality Team Specialist reviewed this patient medical record for the purposes of chart review for care gap closure. The following were reviewed: chart review for care gap closure-diabetic eye exam.    VBCI Quality Team

## 2024-06-11 NOTE — Progress Notes (Signed)
 Samantha Petersen                                          MRN: [id number]   06/11/2024   The VBCI Quality Team Specialist reviewed this patient medical record for the purposes of chart review for care gap closure. The following were reviewed: chart review for care gap closure-glycemic status assessment.    VBCI Quality Team

## 2024-06-14 DIAGNOSIS — G8929 Other chronic pain: Secondary | ICD-10-CM

## 2024-06-14 DIAGNOSIS — I10 Essential (primary) hypertension: Secondary | ICD-10-CM

## 2024-06-14 MED ORDER — IBUPROFEN 800 MG PO TABS: 800.0000 mg | ORAL_TABLET | Freq: Three times a day (TID) | ORAL | 0 refills | Status: DC | PRN

## 2024-06-14 MED ORDER — LISINOPRIL-HYDROCHLOROTHIAZIDE 10-12.5 MG PO TABS: 1.0000 | ORAL_TABLET | Freq: Every day | ORAL | 11 refills | Status: AC

## 2024-06-14 MED ORDER — METFORMIN HCL 1000 MG PO TABS: 1000.0000 mg | ORAL_TABLET | Freq: Two times a day (BID) | ORAL | 11 refills | Status: AC

## 2024-06-22 ENCOUNTER — Other Ambulatory Visit: Payer: Self-pay

## 2024-06-23 ENCOUNTER — Telehealth: Payer: Self-pay | Admitting: *Deleted

## 2024-06-23 NOTE — Telephone Encounter (Signed)
 Call from [facility] asking about denial for refill on patient's Sertraline .  Patient stated at last visit in November of 2025 that she was not taking and did not want to take medications of this type. Pharmacy was informed.  Call to patient through [employer] [phone].  Call to patient at work and home #.  Message was left that the Clinics had called about her request for the Sertraline . Patient was asked to call the Clinics if she would like to get the medications.

## 2024-06-24 ENCOUNTER — Telehealth: Payer: Self-pay | Admitting: *Deleted

## 2024-06-24 NOTE — Telephone Encounter (Signed)
 Will forward to PCO.                           Copied from CRM [id number]. Topic: Clinical - Prescription Issue >> Jun 24, 2024 10:51 AM Chiquita SQUIBB wrote: Reason for CRM: Patient is calling in to let Kenneth know that she is still taking the sertraline  (ZOLOFT ) 100 MG tablet and would like the refill on it.

## 2024-06-25 ENCOUNTER — Other Ambulatory Visit: Payer: Self-pay

## 2024-06-25 ENCOUNTER — Other Ambulatory Visit: Payer: Self-pay | Admitting: Student

## 2024-06-25 DIAGNOSIS — E119 Type 2 diabetes mellitus without complications: Secondary | ICD-10-CM

## 2024-06-25 DIAGNOSIS — F339 Major depressive disorder, recurrent, unspecified: Secondary | ICD-10-CM

## 2024-06-25 MED ORDER — SERTRALINE HCL 100 MG PO TABS
100.0000 mg | ORAL_TABLET | Freq: Every day | ORAL | 2 refills | Status: DC
  Filled 2024-06-25 – 2024-07-07 (×3): qty 90, 90d supply, fill #0

## 2024-06-25 NOTE — Progress Notes (Signed)
 Last month, Dr. Isobel and staff at [facility] requested pharmacy assistance in obtaining Trulicity  for patient. I was unable to successfully outreach her. Placing VBCI referral to pharmacy for continued outreach and placement on my schedule at [facility] for follow-up.   Lorain Baseman, PharmD [facility] [phone]

## 2024-06-28 ENCOUNTER — Other Ambulatory Visit: Payer: Self-pay | Admitting: Student

## 2024-06-28 DIAGNOSIS — G8929 Other chronic pain: Secondary | ICD-10-CM

## 2024-07-02 ENCOUNTER — Other Ambulatory Visit: Payer: Self-pay

## 2024-07-02 NOTE — Telephone Encounter (Signed)
 Received a fax from the pharmacy requesting bd single use swab and prodigy twist top 28 G lancets.  [facility] - [place], [place] - [address]

## 2024-07-03 MED ORDER — PRODIGY VOICE BLOOD GLUCOSE W/DEVICE KIT: PACK | 0 refills | Status: AC

## 2024-07-03 MED ORDER — PRODIGY NO CODING BLOOD GLUC VI STRP: ORAL_STRIP | 11 refills | Status: AC

## 2024-07-05 ENCOUNTER — Other Ambulatory Visit: Payer: Self-pay | Admitting: Student

## 2024-07-05 ENCOUNTER — Telehealth: Payer: Self-pay | Admitting: *Deleted

## 2024-07-05 DIAGNOSIS — G8929 Other chronic pain: Secondary | ICD-10-CM

## 2024-07-05 NOTE — Progress Notes (Unsigned)
 Care Guide Pharmacy Note  07/05/2024 Name: Samantha Petersen MRN: [id number] DOB: [birth year]  Referred By: Harrie Bruckner, DO Reason for referral: Complex Care Management (Initial outreach to schedule referral with PharmD in person )   Samantha Petersen is a 49 y.o. year old female who is a primary care patient of Harrie Bruckner, DO.  Samantha Petersen was referred to the pharmacist for assistance related to: {VBCI ASSESSMENT DISEASE OPTIONS:210923881}  {RXATTEMPTS:31961}  SIG

## 2024-07-06 ENCOUNTER — Other Ambulatory Visit: Payer: Self-pay

## 2024-07-06 NOTE — Progress Notes (Unsigned)
 Care Guide Pharmacy Note  07/06/2024 Name: Samantha Petersen MRN: [id number] DOB: 1975-10-05  Referred By: Harrie Bruckner, DO Reason for referral: Complex Care Management (Initial outreach to schedule referral with PharmD in person )   Samantha Petersen is a 49 y.o. year old female who is a primary care patient of Harrie Bruckner, DO.  Samantha Petersen was referred to the pharmacist for assistance related to: DMII  A second unsuccessful telephone outreach was attempted today to contact the patient who was referred to the pharmacy team for assistance with medication management. Additional attempts will be made to contact the patient.  Samantha Petersen  [facility]  [facility] Guide  Direct Dial: [phone]  Fax [phone]

## 2024-07-07 ENCOUNTER — Other Ambulatory Visit (HOSPITAL_COMMUNITY): Payer: Self-pay

## 2024-07-07 ENCOUNTER — Telehealth: Payer: Self-pay

## 2024-07-07 ENCOUNTER — Other Ambulatory Visit: Payer: Self-pay | Admitting: Student

## 2024-07-07 ENCOUNTER — Other Ambulatory Visit: Payer: Self-pay

## 2024-07-07 DIAGNOSIS — E119 Type 2 diabetes mellitus without complications: Secondary | ICD-10-CM

## 2024-07-07 MED ORDER — PRODIGY TWIST TOP LANCETS 28G MISC: 5 refills | Status: AC

## 2024-07-07 NOTE — Telephone Encounter (Signed)
 Samantha Petersen ([name]) PA Case ID #: [id number] Rx #: [id number] Need Help? Call us  at [phone] Outcome Approved today by [facility] NCPDP 2017 PA Case: [id number], Status: Approved, Coverage Starts on: 06/10/2024 12:00:00 AM, Coverage Ends on: 06/09/2025 12:00:00 AM. Questions? Contact [phone]. Effective Date: 06/10/2024 Authorization Expiration Date: 06/09/2025 Drug Prodigy Voice Blood Glucose w/Device kit ePA cloud logo Form [facility] Electronic PA Form Original Claim Info [phone] [PA REQD CALL [phone], PREFER ACCU-CHEK OR TRUE METRIX]

## 2024-07-07 NOTE — Progress Notes (Signed)
 Care Guide Pharmacy Note  07/07/2024 Name: Samantha Petersen MRN: [id number] DOB: 02-12-1976  Referred By: Harrie Bruckner, DO Reason for referral: Complex Care Management (Initial outreach to schedule referral with PharmD in person )   [name] is a 49 y.o. year old female who is a primary care patient of Harrie Bruckner, DO.  [name] was referred to the pharmacist for assistance related to: DMII  Successful contact was made with the patient to discuss pharmacy services including being ready for the pharmacist to call at least 5 minutes before the scheduled appointment time and to have medication bottles and any blood pressure readings ready for review. The patient agreed to meet with the pharmacist via in office 07/26/24 at 9:00 am  on (date/time).  [name]  [facility]  [facility], [facility] Guide  Direct Dial: [phone]  Fax [phone]   SIG

## 2024-07-07 NOTE — Telephone Encounter (Signed)
 Prior Authorization for patient ([employer] No Coding Blood Gluc strips) came through on cover my meds. Per cmm Electronic Prior Authorization not supported. Submit via other methods..  Can we get this submitted? Thanks.

## 2024-07-07 NOTE — Telephone Encounter (Signed)
 Prior authorization for patient ([employer] Voice Blood Glucose w/Device kit) came through on cover my meds was submitted with last office notes and labs awaiting approval or denial.  [id number]

## 2024-07-12 ENCOUNTER — Other Ambulatory Visit (HOSPITAL_COMMUNITY): Payer: Self-pay

## 2024-07-13 ENCOUNTER — Other Ambulatory Visit: Payer: Self-pay

## 2024-07-13 DIAGNOSIS — F339 Major depressive disorder, recurrent, unspecified: Secondary | ICD-10-CM

## 2024-07-13 MED ORDER — SERTRALINE HCL 100 MG PO TABS: 100.0000 mg | ORAL_TABLET | Freq: Every day | ORAL | 2 refills | Status: AC

## 2024-07-13 NOTE — Telephone Encounter (Signed)
 Medication sent to pharmacy

## 2024-07-13 NOTE — Telephone Encounter (Signed)
Thank you Camille! 

## 2024-07-26 ENCOUNTER — Ambulatory Visit: Payer: Self-pay
